# Patient Record
Sex: Female | Born: 1965 | Race: White | Hispanic: No | Marital: Married | Smoking: Never smoker
Health system: Southern US, Community
[De-identification: ages and names within clinical notes are randomized; demographics above are authoritative.]

## PROBLEM LIST (undated history)

## (undated) ENCOUNTER — Emergency Department (HOSPITAL_COMMUNITY): Payer: Self-pay | Source: Home / Self Care

## (undated) DIAGNOSIS — R0600 Dyspnea, unspecified: Secondary | ICD-10-CM

## (undated) DIAGNOSIS — E785 Hyperlipidemia, unspecified: Secondary | ICD-10-CM

## (undated) DIAGNOSIS — I10 Essential (primary) hypertension: Secondary | ICD-10-CM

## (undated) DIAGNOSIS — K219 Gastro-esophageal reflux disease without esophagitis: Secondary | ICD-10-CM

## (undated) HISTORY — DX: Hyperlipidemia, unspecified: E78.5

## (undated) HISTORY — DX: Essential (primary) hypertension: I10

## (undated) HISTORY — PX: BREAST BIOPSY: SHX20

---

## 2011-09-21 ENCOUNTER — Other Ambulatory Visit (HOSPITAL_COMMUNITY): Payer: Self-pay | Admitting: Family Medicine

## 2011-09-21 DIAGNOSIS — Z1231 Encounter for screening mammogram for malignant neoplasm of breast: Secondary | ICD-10-CM

## 2011-09-26 ENCOUNTER — Ambulatory Visit (HOSPITAL_COMMUNITY)
Admission: RE | Admit: 2011-09-26 | Discharge: 2011-09-26 | Disposition: A | Discharge status: Home / Self Care | Payer: Self-pay | Source: Ambulatory Visit | Attending: Family Medicine | Admitting: Family Medicine

## 2011-09-26 DIAGNOSIS — Z1231 Encounter for screening mammogram for malignant neoplasm of breast: Secondary | ICD-10-CM

## 2013-10-28 ENCOUNTER — Other Ambulatory Visit (HOSPITAL_COMMUNITY): Payer: Self-pay | Admitting: Internal Medicine

## 2013-10-28 DIAGNOSIS — Z1231 Encounter for screening mammogram for malignant neoplasm of breast: Secondary | ICD-10-CM

## 2013-11-04 ENCOUNTER — Ambulatory Visit (HOSPITAL_COMMUNITY)
Admission: RE | Admit: 2013-11-04 | Discharge: 2013-11-04 | Disposition: A | Discharge status: Home / Self Care | Payer: Self-pay | Source: Ambulatory Visit | Attending: Internal Medicine | Admitting: Internal Medicine

## 2013-11-04 DIAGNOSIS — Z1231 Encounter for screening mammogram for malignant neoplasm of breast: Secondary | ICD-10-CM

## 2015-09-24 ENCOUNTER — Other Ambulatory Visit (HOSPITAL_COMMUNITY): Payer: Self-pay | Admitting: *Deleted

## 2015-09-24 DIAGNOSIS — Z1231 Encounter for screening mammogram for malignant neoplasm of breast: Secondary | ICD-10-CM

## 2015-10-01 ENCOUNTER — Ambulatory Visit (HOSPITAL_COMMUNITY)
Admission: RE | Admit: 2015-10-01 | Discharge: 2015-10-01 | Disposition: A | Discharge status: Home / Self Care | Payer: Self-pay | Source: Ambulatory Visit | Attending: *Deleted | Admitting: *Deleted

## 2015-10-01 DIAGNOSIS — Z1231 Encounter for screening mammogram for malignant neoplasm of breast: Secondary | ICD-10-CM | POA: Insufficient documentation

## 2015-10-05 ENCOUNTER — Other Ambulatory Visit: Payer: Self-pay | Admitting: *Deleted

## 2015-10-05 DIAGNOSIS — R928 Other abnormal and inconclusive findings on diagnostic imaging of breast: Secondary | ICD-10-CM

## 2015-10-07 ENCOUNTER — Ambulatory Visit (HOSPITAL_COMMUNITY): Payer: Self-pay

## 2015-10-12 ENCOUNTER — Other Ambulatory Visit (HOSPITAL_COMMUNITY): Payer: Self-pay | Admitting: *Deleted

## 2015-10-12 DIAGNOSIS — R928 Other abnormal and inconclusive findings on diagnostic imaging of breast: Secondary | ICD-10-CM

## 2015-10-20 ENCOUNTER — Other Ambulatory Visit: Payer: Self-pay | Admitting: Internal Medicine

## 2015-10-20 ENCOUNTER — Ambulatory Visit (HOSPITAL_COMMUNITY)
Admission: RE | Admit: 2015-10-20 | Discharge: 2015-10-20 | Disposition: A | Discharge status: Home / Self Care | Payer: PRIVATE HEALTH INSURANCE | Source: Ambulatory Visit | Attending: *Deleted | Admitting: *Deleted

## 2015-10-20 ENCOUNTER — Other Ambulatory Visit (HOSPITAL_COMMUNITY): Payer: Self-pay | Admitting: Internal Medicine

## 2015-10-20 ENCOUNTER — Ambulatory Visit (HOSPITAL_COMMUNITY)
Admission: RE | Admit: 2015-10-20 | Discharge: 2015-10-20 | Disposition: A | Discharge status: Home / Self Care | Payer: PRIVATE HEALTH INSURANCE | Source: Ambulatory Visit | Attending: Internal Medicine | Admitting: Internal Medicine

## 2015-10-20 DIAGNOSIS — N6489 Other specified disorders of breast: Secondary | ICD-10-CM

## 2015-10-20 DIAGNOSIS — R928 Other abnormal and inconclusive findings on diagnostic imaging of breast: Secondary | ICD-10-CM | POA: Diagnosis present

## 2015-10-27 ENCOUNTER — Encounter (HOSPITAL_COMMUNITY): Payer: Self-pay

## 2015-10-30 ENCOUNTER — Other Ambulatory Visit: Payer: Self-pay | Admitting: Diagnostic Radiology

## 2015-10-30 ENCOUNTER — Ambulatory Visit
Admission: RE | Admit: 2015-10-30 | Discharge: 2015-10-30 | Disposition: A | Discharge status: Home / Self Care | Payer: No Typology Code available for payment source | Source: Ambulatory Visit | Attending: Internal Medicine | Admitting: Internal Medicine

## 2015-10-30 ENCOUNTER — Other Ambulatory Visit: Payer: Self-pay | Admitting: Internal Medicine

## 2015-10-30 DIAGNOSIS — R928 Other abnormal and inconclusive findings on diagnostic imaging of breast: Secondary | ICD-10-CM

## 2016-03-25 ENCOUNTER — Other Ambulatory Visit: Payer: Self-pay | Admitting: Internal Medicine

## 2016-03-25 DIAGNOSIS — N6489 Other specified disorders of breast: Secondary | ICD-10-CM

## 2016-04-25 ENCOUNTER — Other Ambulatory Visit: Payer: No Typology Code available for payment source

## 2016-10-19 ENCOUNTER — Other Ambulatory Visit: Payer: Self-pay | Admitting: Internal Medicine

## 2016-10-19 DIAGNOSIS — N6489 Other specified disorders of breast: Secondary | ICD-10-CM

## 2016-10-27 ENCOUNTER — Ambulatory Visit
Admission: RE | Admit: 2016-10-27 | Discharge: 2016-10-27 | Disposition: A | Discharge status: Home / Self Care | Payer: BLUE CROSS/BLUE SHIELD | Source: Ambulatory Visit | Attending: Internal Medicine | Admitting: Internal Medicine

## 2016-10-27 DIAGNOSIS — N6489 Other specified disorders of breast: Secondary | ICD-10-CM

## 2018-08-01 ENCOUNTER — Other Ambulatory Visit: Payer: Self-pay | Admitting: Internal Medicine

## 2018-08-01 DIAGNOSIS — Z1231 Encounter for screening mammogram for malignant neoplasm of breast: Secondary | ICD-10-CM

## 2018-08-31 ENCOUNTER — Ambulatory Visit
Admission: RE | Admit: 2018-08-31 | Discharge: 2018-08-31 | Disposition: A | Discharge status: Home / Self Care | Payer: BLUE CROSS/BLUE SHIELD | Source: Ambulatory Visit | Attending: Internal Medicine | Admitting: Internal Medicine

## 2018-08-31 DIAGNOSIS — Z1231 Encounter for screening mammogram for malignant neoplasm of breast: Secondary | ICD-10-CM

## 2019-08-21 ENCOUNTER — Other Ambulatory Visit: Payer: Self-pay | Admitting: *Deleted

## 2019-08-21 DIAGNOSIS — Z20822 Contact with and (suspected) exposure to covid-19: Secondary | ICD-10-CM

## 2019-08-23 LAB — NOVEL CORONAVIRUS, NAA: SARS-CoV-2, NAA: NOT DETECTED

## 2019-10-14 ENCOUNTER — Other Ambulatory Visit: Payer: Self-pay | Admitting: Obstetrics and Gynecology

## 2019-10-14 DIAGNOSIS — Z1231 Encounter for screening mammogram for malignant neoplasm of breast: Secondary | ICD-10-CM

## 2019-10-17 ENCOUNTER — Other Ambulatory Visit: Payer: Self-pay

## 2019-10-17 ENCOUNTER — Ambulatory Visit
Admission: RE | Admit: 2019-10-17 | Discharge: 2019-10-17 | Disposition: A | Discharge status: Home / Self Care | Payer: BLUE CROSS/BLUE SHIELD | Source: Ambulatory Visit | Attending: Obstetrics and Gynecology | Admitting: Obstetrics and Gynecology

## 2019-10-17 DIAGNOSIS — Z1231 Encounter for screening mammogram for malignant neoplasm of breast: Secondary | ICD-10-CM

## 2019-10-18 ENCOUNTER — Other Ambulatory Visit: Payer: Self-pay | Admitting: Obstetrics and Gynecology

## 2019-10-18 DIAGNOSIS — R928 Other abnormal and inconclusive findings on diagnostic imaging of breast: Secondary | ICD-10-CM

## 2019-10-24 ENCOUNTER — Other Ambulatory Visit: Payer: Self-pay | Admitting: Obstetrics and Gynecology

## 2019-10-24 ENCOUNTER — Other Ambulatory Visit: Payer: Self-pay

## 2019-10-24 ENCOUNTER — Ambulatory Visit
Admission: RE | Admit: 2019-10-24 | Discharge: 2019-10-24 | Disposition: A | Discharge status: Home / Self Care | Payer: BC Managed Care – PPO | Source: Ambulatory Visit | Attending: Obstetrics and Gynecology | Admitting: Obstetrics and Gynecology

## 2019-10-24 DIAGNOSIS — R928 Other abnormal and inconclusive findings on diagnostic imaging of breast: Secondary | ICD-10-CM

## 2019-10-24 DIAGNOSIS — N6489 Other specified disorders of breast: Secondary | ICD-10-CM

## 2019-10-29 ENCOUNTER — Other Ambulatory Visit: Payer: Self-pay | Admitting: Obstetrics and Gynecology

## 2019-10-29 ENCOUNTER — Ambulatory Visit
Admission: RE | Admit: 2019-10-29 | Discharge: 2019-10-29 | Disposition: A | Discharge status: Home / Self Care | Payer: BC Managed Care – PPO | Source: Ambulatory Visit | Attending: Obstetrics and Gynecology | Admitting: Obstetrics and Gynecology

## 2019-10-29 ENCOUNTER — Other Ambulatory Visit: Payer: Self-pay

## 2019-10-29 DIAGNOSIS — N6489 Other specified disorders of breast: Secondary | ICD-10-CM

## 2020-04-29 ENCOUNTER — Other Ambulatory Visit: Payer: Self-pay

## 2020-04-29 ENCOUNTER — Other Ambulatory Visit: Payer: Self-pay | Admitting: Obstetrics and Gynecology

## 2020-04-29 ENCOUNTER — Ambulatory Visit
Admission: RE | Admit: 2020-04-29 | Discharge: 2020-04-29 | Disposition: A | Discharge status: Home / Self Care | Payer: BC Managed Care – PPO | Source: Ambulatory Visit | Attending: Obstetrics and Gynecology | Admitting: Obstetrics and Gynecology

## 2020-04-29 DIAGNOSIS — N6489 Other specified disorders of breast: Secondary | ICD-10-CM

## 2020-05-06 ENCOUNTER — Ambulatory Visit
Admission: RE | Admit: 2020-05-06 | Discharge: 2020-05-06 | Disposition: A | Discharge status: Home / Self Care | Payer: 59 | Source: Ambulatory Visit | Attending: Obstetrics and Gynecology | Admitting: Obstetrics and Gynecology

## 2020-05-06 ENCOUNTER — Other Ambulatory Visit: Payer: Self-pay

## 2020-05-06 DIAGNOSIS — N6489 Other specified disorders of breast: Secondary | ICD-10-CM

## 2020-05-29 ENCOUNTER — Other Ambulatory Visit: Payer: Self-pay | Admitting: Surgery

## 2020-05-29 DIAGNOSIS — N6022 Fibroadenosis of left breast: Secondary | ICD-10-CM

## 2020-06-11 ENCOUNTER — Other Ambulatory Visit: Payer: Self-pay | Admitting: Surgery

## 2020-06-11 DIAGNOSIS — N6022 Fibroadenosis of left breast: Secondary | ICD-10-CM

## 2020-07-03 ENCOUNTER — Encounter: Payer: Self-pay | Admitting: Emergency Medicine

## 2020-07-03 ENCOUNTER — Other Ambulatory Visit: Payer: Self-pay

## 2020-07-03 ENCOUNTER — Ambulatory Visit
Admission: EM | Admit: 2020-07-03 | Discharge: 2020-07-03 | Disposition: A | Discharge status: Home / Self Care | Payer: 59 | Attending: Family Medicine | Admitting: Family Medicine

## 2020-07-03 DIAGNOSIS — L245 Irritant contact dermatitis due to other chemical products: Secondary | ICD-10-CM

## 2020-07-03 DIAGNOSIS — N898 Other specified noninflammatory disorders of vagina: Secondary | ICD-10-CM | POA: Diagnosis not present

## 2020-07-03 DIAGNOSIS — R21 Rash and other nonspecific skin eruption: Secondary | ICD-10-CM

## 2020-07-03 MED ORDER — METHYLPREDNISOLONE SODIUM SUCC 125 MG IJ SOLR
125.0000 mg | Freq: Once | INTRAMUSCULAR | Status: AC
  Administered 2020-07-03: 125 mg via INTRAMUSCULAR

## 2020-07-03 MED ORDER — TRIAMCINOLONE ACETONIDE 0.1 % EX CREA: 1.0000 "application " | TOPICAL_CREAM | Freq: Two times a day (BID) | CUTANEOUS | 0 refills | Status: AC

## 2020-07-03 MED ORDER — PREDNISONE 10 MG (21) PO TBPK: ORAL_TABLET | Freq: Every day | ORAL | 0 refills | Status: AC

## 2020-07-03 MED ORDER — FLUCONAZOLE 200 MG PO TABS: 200.0000 mg | ORAL_TABLET | Freq: Once | ORAL | 0 refills | Status: AC

## 2020-07-03 NOTE — Discharge Instructions (Signed)
You have contact dermatitis. This is a rash that appears when your skin is exposed to an irritant.   Eucerin or Aquaphor with triamcinolone cream  Benadryl at night for itching  Use the steroid cream no more than three times per day.  Follow up with your primary care provider, or to see [place] as needed.  Report to the emergency room for shortness of breath, high fever, severe diarrhea, or other concerning symptoms.

## 2020-07-03 NOTE — ED Triage Notes (Signed)
pt states she has a rash all over her body x 4 days states the rash is itchy and burns. Pt is unsure what she has come in contact with. Pt also complains of a yeast infection and would like medication for that.

## 2020-07-06 NOTE — ED Provider Notes (Signed)
[facility]   [id number] 07/03/20 Arrival Time: 1648  CC: RASH  SUBJECTIVE:  Samantha Carr is a 54 y.o. female who presents with a skin complaint that began 4 days ago. Reports that she has been swimming and that she used an anti-algae chemical in her pool. Reports that the is the same brand that she usually uses. She reports that she usually waits longer to get in the pool afterward than she did with last use. Denies medications change or starting a new medication recently.  Reports rash as red, raised and itchy. Has used hydrocortisone cream without relief. There are no aggravating or alleviating factors.   Denies fever, chills, nausea, vomiting, erythema, swelling, discharge, oral lesions, SOB, chest pain, abdominal pain, changes in bowel or bladder function.    Also reports vaginal discharge and itching. Reports hx yeast infections. Requesting diflucan prescription. Denies concern for STDs.  ROS: As per HPI.  All other pertinent ROS negative.     History reviewed. No pertinent past medical history. Past Surgical History:  Procedure Laterality Date  . BREAST BIOPSY Left    No Known Allergies No current facility-administered medications on file prior to encounter.   No current outpatient medications on file prior to encounter.   Social History   Socioeconomic History  . Marital status: Married    Spouse name: Not on file  . Number of children: Not on file  . Years of education: Not on file  . Highest education level: Not on file  Occupational History  . Not on file  Tobacco Use  . Smoking status: Never Smoker  . Smokeless tobacco: Never Used  Substance and Sexual Activity  . Alcohol use: Not on file  . Drug use: Not on file  . Sexual activity: Not on file  Other Topics Concern  . Not on file  Social History Narrative  . Not on file   Social Determinants of Health   Financial Resource Strain:   . Difficulty of Paying Living Expenses: Not on file    Food Insecurity:   . Worried About Programme researcher, broadcasting/film/video in the Last Year: Not on file  . Ran Out of Food in the Last Year: Not on file  Transportation Needs:   . Lack of Transportation (Medical): Not on file  . Lack of Transportation (Non-Medical): Not on file  Physical Activity:   . Days of Exercise per Week: Not on file  . Minutes of Exercise per Session: Not on file  Stress:   . Feeling of Stress : Not on file  Social Connections:   . Frequency of Communication with Friends and Family: Not on file  . Frequency of Social Gatherings with Friends and Family: Not on file  . Attends Religious Services: Not on file  . Active Member of Clubs or Organizations: Not on file  . Attends Banker Meetings: Not on file  . Marital Status: Not on file  Intimate Partner Violence:   . Fear of Current or Ex-Partner: Not on file  . Emotionally Abused: Not on file  . Physically Abused: Not on file  . Sexually Abused: Not on file   Family History  Problem Relation Age of Onset  . Breast cancer Maternal Aunt     OBJECTIVE: Vitals:   07/03/20 1807  BP: (!) 151/92  Pulse: 88  Resp: 17  Temp: 98.8 F (37.1 C)  TempSrc: Oral  SpO2: 97%    General appearance: alert; no distress Head: NCAT Lungs:  clear to auscultation bilaterally Heart: regular rate and rhythm.  Radial pulse 2+ bilaterally Extremities: no edema Skin: warm and dry; papular urticaria, generalized to trunk, shoulders, arms Psychological: alert and cooperative; normal mood and affect  ASSESSMENT & PLAN:  1. Irritant contact dermatitis due to other chemical products   2. Rash and nonspecific skin eruption   3. Vaginal itching   4. Vaginal discharge     Meds ordered this encounter  Medications  . fluconazole (DIFLUCAN) 200 MG tablet    Sig: Take 1 tablet (200 mg total) by mouth once for 1 dose.    Dispense:  2 tablet    Refill:  0    Order Specific Question:   Supervising Provider    Answer:   Merrilee Jansky [id number]  . triamcinolone cream (KENALOG) 0.1 %    Sig: Apply 1 application topically 2 (two) times daily.    Dispense:  30 g    Refill:  0    Order Specific Question:   Supervising Provider    Answer:   Merrilee Jansky [id number]  . methylPREDNISolone sodium succinate (SOLU-MEDROL) 125 mg/2 mL injection 125 mg  . predniSONE (STERAPRED UNI-PAK 21 TAB) 10 MG (21) TBPK tablet    Sig: Take by mouth daily for 6 days. Take 6 tablets on day 1, 5 tablets on day 2, 4 tablets on day 3, 3 tablets on day 4, 2 tablets on day 5, 1 tablet on day 6    Dispense:  21 tablet    Refill:  0    Order Specific Question:   Supervising Provider    Answer:   Merrilee Jansky [[id number]]    Solumedrol 125mg  in office today Prescribed triamcinolone cream Prescribed prednisone taper Prescribed fluconazole Highly suspect irritant dermatitis from pool chemical Take as prescribed and to completion Avoid hot showers/ baths Moisturize skin daily  Follow up with PCP if symptoms persists Return or go to the ER if you have any new or worsening symptoms such as fever, chills, nausea, vomiting, redness, swelling, discharge, if symptoms do not improve with medications  Reviewed expectations re: course of current medical issues. Questions answered. Outlined signs and symptoms indicating need for more acute intervention. Patient verbalized understanding. After Visit Summary given.   , NP 07/06/20 1045

## 2020-07-08 ENCOUNTER — Encounter: Payer: Self-pay | Admitting: *Deleted

## 2020-07-08 ENCOUNTER — Other Ambulatory Visit: Payer: 59

## 2020-07-09 ENCOUNTER — Ambulatory Visit: Payer: 59 | Admitting: Cardiology

## 2020-07-15 ENCOUNTER — Other Ambulatory Visit: Payer: Self-pay

## 2020-07-15 ENCOUNTER — Other Ambulatory Visit (HOSPITAL_COMMUNITY)
Admission: RE | Admit: 2020-07-15 | Discharge: 2020-07-15 | Disposition: A | Discharge status: Home / Self Care | Payer: 59 | Source: Ambulatory Visit | Attending: Internal Medicine | Admitting: Internal Medicine

## 2020-07-15 DIAGNOSIS — Z01812 Encounter for preprocedural laboratory examination: Secondary | ICD-10-CM | POA: Diagnosis present

## 2020-07-15 DIAGNOSIS — Z20822 Contact with and (suspected) exposure to covid-19: Secondary | ICD-10-CM | POA: Insufficient documentation

## 2020-07-15 LAB — SARS CORONAVIRUS 2 (TAT 6-24 HRS): SARS Coronavirus 2: NEGATIVE

## 2020-07-16 ENCOUNTER — Other Ambulatory Visit (HOSPITAL_COMMUNITY): Payer: Self-pay | Admitting: Internal Medicine

## 2020-07-16 DIAGNOSIS — R0600 Dyspnea, unspecified: Secondary | ICD-10-CM

## 2020-07-16 DIAGNOSIS — R0609 Other forms of dyspnea: Secondary | ICD-10-CM

## 2020-07-17 ENCOUNTER — Other Ambulatory Visit: Payer: Self-pay | Admitting: *Deleted

## 2020-07-17 ENCOUNTER — Ambulatory Visit (HOSPITAL_COMMUNITY)
Admission: RE | Admit: 2020-07-17 | Discharge: 2020-07-17 | Disposition: A | Discharge status: Home / Self Care | Payer: 59 | Source: Ambulatory Visit | Attending: Internal Medicine | Admitting: Internal Medicine

## 2020-07-17 ENCOUNTER — Other Ambulatory Visit (HOSPITAL_COMMUNITY): Payer: Self-pay | Admitting: Internal Medicine

## 2020-07-17 ENCOUNTER — Other Ambulatory Visit: Payer: Self-pay

## 2020-07-17 ENCOUNTER — Ambulatory Visit (HOSPITAL_BASED_OUTPATIENT_CLINIC_OR_DEPARTMENT_OTHER)
Admission: RE | Admit: 2020-07-17 | Discharge: 2020-07-17 | Disposition: A | Discharge status: Home / Self Care | Payer: 59 | Source: Ambulatory Visit | Attending: Internal Medicine | Admitting: Internal Medicine

## 2020-07-17 DIAGNOSIS — R0600 Dyspnea, unspecified: Secondary | ICD-10-CM

## 2020-07-17 DIAGNOSIS — Z01818 Encounter for other preprocedural examination: Secondary | ICD-10-CM

## 2020-07-17 DIAGNOSIS — R0602 Shortness of breath: Secondary | ICD-10-CM

## 2020-07-17 DIAGNOSIS — R0609 Other forms of dyspnea: Secondary | ICD-10-CM

## 2020-07-17 LAB — EXERCISE TOLERANCE TEST
Estimated workload: 7 METS
Exercise duration (min): 4 min
Exercise duration (sec): 3 s
MPHR: 168 {beats}/min
Peak HR: 153 {beats}/min
Percent HR: 91 %
RPE: 16
Rest HR: 81 {beats}/min

## 2020-07-17 LAB — ECHOCARDIOGRAM COMPLETE
AR max vel: 2.34 cm2
AV Area VTI: 2.13 cm2
AV Area mean vel: 2.18 cm2
AV Mean grad: 4.1 mmHg
AV Peak grad: 7.6 mmHg
Ao pk vel: 1.38 m/s
Area-P 1/2: 4.15 cm2
S' Lateral: 1.86 cm

## 2020-07-17 NOTE — Progress Notes (Signed)
*  PRELIMINARY RESULTS* Echocardiogram 2D Echocardiogram has been performed.  Samantha Carr 07/17/2020, 12:24 PM

## 2020-07-17 NOTE — Progress Notes (Signed)
Faxed order placed in chart

## 2020-07-18 ENCOUNTER — Other Ambulatory Visit (HOSPITAL_COMMUNITY): Payer: 59

## 2020-07-21 ENCOUNTER — Telehealth: Payer: Self-pay | Admitting: Cardiology

## 2020-07-21 NOTE — Telephone Encounter (Signed)
Fax [phone]    Provider office called and they want interpretation faxed to them from the [facility]

## 2020-07-21 NOTE — Telephone Encounter (Signed)
Faxes sent to [facility] and received by [place].

## 2020-08-15 ENCOUNTER — Other Ambulatory Visit (HOSPITAL_COMMUNITY): Payer: 59

## 2020-08-18 ENCOUNTER — Encounter (HOSPITAL_BASED_OUTPATIENT_CLINIC_OR_DEPARTMENT_OTHER): Payer: Self-pay | Admitting: Surgery

## 2020-08-20 ENCOUNTER — Encounter (HOSPITAL_BASED_OUTPATIENT_CLINIC_OR_DEPARTMENT_OTHER): Payer: Self-pay | Admitting: Surgery

## 2020-08-20 ENCOUNTER — Other Ambulatory Visit: Payer: Self-pay

## 2020-08-22 ENCOUNTER — Inpatient Hospital Stay (HOSPITAL_COMMUNITY): Admission: RE | Admit: 2020-08-22 | Payer: 59 | Source: Ambulatory Visit

## 2020-08-24 ENCOUNTER — Other Ambulatory Visit (HOSPITAL_COMMUNITY)
Admission: RE | Admit: 2020-08-24 | Discharge: 2020-08-24 | Disposition: A | Discharge status: Home / Self Care | Payer: 59 | Source: Ambulatory Visit | Attending: Surgery | Admitting: Surgery

## 2020-08-24 DIAGNOSIS — Z20822 Contact with and (suspected) exposure to covid-19: Secondary | ICD-10-CM | POA: Insufficient documentation

## 2020-08-24 DIAGNOSIS — Z01812 Encounter for preprocedural laboratory examination: Secondary | ICD-10-CM | POA: Insufficient documentation

## 2020-08-24 LAB — SARS CORONAVIRUS 2 (TAT 6-24 HRS): SARS Coronavirus 2: NEGATIVE

## 2020-08-25 ENCOUNTER — Ambulatory Visit
Admission: RE | Admit: 2020-08-25 | Discharge: 2020-08-25 | Disposition: A | Discharge status: Home / Self Care | Payer: 59 | Source: Ambulatory Visit | Attending: Surgery | Admitting: Surgery

## 2020-08-25 ENCOUNTER — Other Ambulatory Visit: Payer: Self-pay

## 2020-08-25 DIAGNOSIS — N6022 Fibroadenosis of left breast: Secondary | ICD-10-CM

## 2020-08-25 MED ORDER — ENSURE PRE-SURGERY PO LIQD: 296.0000 mL | Freq: Once | ORAL | Status: DC

## 2020-08-25 NOTE — H&P (Signed)
   Samantha Carr  Location: [facility] Patient #: [id number] DOB: [birth year] Married / Language: English / Race: White Female   History of Present Illness  The patient is a 54 year old female who presents with a complaint of Breast problems.  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 She has developed another suspicious area in the left breast. It was followed initially and was going to be biopsied in December but could not be localized on ultrasound. Then again persistent follow-up films in June of this year. She underwent a another mammogram  showing a 8 mm lesion at the 3 o'clock position of the left breast. This was finally biopsied showing a complex sclerosing lesion with ductal hyperplasia. She has a family history of a maternal aunt who developed breast cancer in her 82s and passed away from breast cancer. She is otherwise healthy without complaints. She denies nipple discharge.   Allergies (Tanisha A. Manson Passey, RMA; 05/29/2020 9:21 AM) No Known Drug Allergies  [05/29/2020]: Allergies Reconciled   Medication History (Tanisha A. Manson Passey, RMA; 05/29/2020 9:21 AM) Pravastatin Sodium (20MG  Tablet, Oral) Active. Losartan Potassium (100MG  Tablet, Oral) Active. Vitamin D (50 MCG(2000 UT) Tablet, Oral) Active. Medications Reconciled  Vitals   Weight: 211.4 lb Height: 64in Body Surface Area: 2 m Body Mass Index: 36.29 kg/m  Temp.: 98.4F  Pulse: 103 (Regular)  BP: 132/86(Sitting, Left Arm, Standard)     Physical Exam  The physical exam findings are as follows: Note: She appears well and examination  There are no palpable breast masses and no axillary adenopathy. 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 She has a complex sclerosing lesion on the left breast. I discussed the diagnosis with the patient and her husband. I gave them a copy of the pathology results. Given the abnormality on mammogram, the pathologic findings, and her family history of breast cancer, a radioactive seed left breast lumpectomy is recommended. I discussed the procedure with them in detail. I discussed the risk of the procedure which includes but is not limited to bleeding, infection, injury to surrounding structures, the need for further procedures if malignancy is found, cardiopulmonary issues, postoperative recovery, etc. They understand and wish to proceed with surgery.

## 2020-08-25 NOTE — Progress Notes (Signed)

## 2020-08-26 ENCOUNTER — Ambulatory Visit (HOSPITAL_BASED_OUTPATIENT_CLINIC_OR_DEPARTMENT_OTHER): Payer: 59 | Admitting: Certified Registered"

## 2020-08-26 ENCOUNTER — Other Ambulatory Visit: Payer: Self-pay

## 2020-08-26 ENCOUNTER — Encounter (HOSPITAL_BASED_OUTPATIENT_CLINIC_OR_DEPARTMENT_OTHER): Payer: Self-pay | Admitting: Surgery

## 2020-08-26 ENCOUNTER — Ambulatory Visit (HOSPITAL_COMMUNITY)
Admission: RE | Admit: 2020-08-26 | Discharge: 2020-08-26 | Disposition: A | Discharge status: Home / Self Care | Payer: 59 | Attending: Surgery | Admitting: Surgery

## 2020-08-26 ENCOUNTER — Ambulatory Visit
Admission: RE | Admit: 2020-08-26 | Discharge: 2020-08-26 | Disposition: A | Discharge status: Home / Self Care | Payer: 59 | Source: Ambulatory Visit | Attending: Surgery | Admitting: Surgery

## 2020-08-26 ENCOUNTER — Encounter (HOSPITAL_BASED_OUTPATIENT_CLINIC_OR_DEPARTMENT_OTHER)
Admission: RE | Disposition: A | Discharge status: Home / Self Care | Payer: Self-pay | Source: Home / Self Care | Attending: Surgery

## 2020-08-26 DIAGNOSIS — Z803 Family history of malignant neoplasm of breast: Secondary | ICD-10-CM | POA: Diagnosis not present

## 2020-08-26 DIAGNOSIS — N6092 Unspecified benign mammary dysplasia of left breast: Secondary | ICD-10-CM | POA: Insufficient documentation

## 2020-08-26 DIAGNOSIS — N63 Unspecified lump in unspecified breast: Secondary | ICD-10-CM | POA: Diagnosis present

## 2020-08-26 DIAGNOSIS — K219 Gastro-esophageal reflux disease without esophagitis: Secondary | ICD-10-CM | POA: Diagnosis not present

## 2020-08-26 DIAGNOSIS — I10 Essential (primary) hypertension: Secondary | ICD-10-CM | POA: Insufficient documentation

## 2020-08-26 DIAGNOSIS — N6022 Fibroadenosis of left breast: Secondary | ICD-10-CM | POA: Diagnosis not present

## 2020-08-26 DIAGNOSIS — Z79899 Other long term (current) drug therapy: Secondary | ICD-10-CM | POA: Insufficient documentation

## 2020-08-26 HISTORY — PX: BREAST LUMPECTOMY WITH RADIOACTIVE SEED LOCALIZATION: SHX6424

## 2020-08-26 HISTORY — DX: Gastro-esophageal reflux disease without esophagitis: K21.9

## 2020-08-26 HISTORY — DX: Dyspnea, unspecified: R06.00

## 2020-08-26 SURGERY — BREAST LUMPECTOMY WITH RADIOACTIVE SEED LOCALIZATION
Anesthesia: General | Site: Breast | Laterality: Left

## 2020-08-26 MED ORDER — ACETAMINOPHEN 500 MG PO TABS
1000.0000 mg | ORAL_TABLET | ORAL | Status: AC
  Administered 2020-08-26: 1000 mg via ORAL

## 2020-08-26 MED ORDER — CHLORHEXIDINE GLUCONATE CLOTH 2 % EX PADS: 6.0000 | MEDICATED_PAD | Freq: Once | CUTANEOUS | Status: DC

## 2020-08-26 MED ORDER — HYDROMORPHONE HCL 1 MG/ML IJ SOLN
INTRAMUSCULAR | Status: AC
  Filled 2020-08-26: qty 0.5

## 2020-08-26 MED ORDER — BUPIVACAINE HCL (PF) 0.25 % IJ SOLN
INTRAMUSCULAR | Status: AC
  Filled 2020-08-26: qty 30

## 2020-08-26 MED ORDER — TRAMADOL HCL 50 MG PO TABS: 50.0000 mg | ORAL_TABLET | Freq: Four times a day (QID) | ORAL | 0 refills | Status: AC | PRN

## 2020-08-26 MED ORDER — CEFAZOLIN SODIUM-DEXTROSE 2-4 GM/100ML-% IV SOLN
INTRAVENOUS | Status: AC
  Filled 2020-08-26: qty 100

## 2020-08-26 MED ORDER — FENTANYL CITRATE (PF) 100 MCG/2ML IJ SOLN
INTRAMUSCULAR | Status: AC
  Filled 2020-08-26: qty 2

## 2020-08-26 MED ORDER — MIDAZOLAM HCL 5 MG/5ML IJ SOLN
INTRAMUSCULAR | Status: DC | PRN
  Administered 2020-08-26: 2 mg via INTRAVENOUS

## 2020-08-26 MED ORDER — PROPOFOL 500 MG/50ML IV EMUL
INTRAVENOUS | Status: DC | PRN
  Administered 2020-08-26: 25 ug/kg/min via INTRAVENOUS

## 2020-08-26 MED ORDER — AMISULPRIDE (ANTIEMETIC) 5 MG/2ML IV SOLN
5.0000 mg | Freq: Once | INTRAVENOUS | Status: AC
  Administered 2020-08-26: 5 mg via INTRAVENOUS

## 2020-08-26 MED ORDER — CEFAZOLIN SODIUM-DEXTROSE 2-4 GM/100ML-% IV SOLN: 2.0000 g | INTRAVENOUS | Status: DC

## 2020-08-26 MED ORDER — MIDAZOLAM HCL 2 MG/2ML IJ SOLN
INTRAMUSCULAR | Status: AC
  Filled 2020-08-26: qty 2

## 2020-08-26 MED ORDER — BUPIVACAINE HCL (PF) 0.25 % IJ SOLN
INTRAMUSCULAR | Status: DC | PRN
  Administered 2020-08-26: 15 mL

## 2020-08-26 MED ORDER — FENTANYL CITRATE (PF) 100 MCG/2ML IJ SOLN
INTRAMUSCULAR | Status: DC | PRN
  Administered 2020-08-26: 100 ug via INTRAVENOUS

## 2020-08-26 MED ORDER — CELECOXIB 200 MG PO CAPS
400.0000 mg | ORAL_CAPSULE | ORAL | Status: AC
  Administered 2020-08-26: 400 mg via ORAL

## 2020-08-26 MED ORDER — HYDROMORPHONE HCL 1 MG/ML IJ SOLN
0.2500 mg | INTRAMUSCULAR | Status: DC | PRN
  Administered 2020-08-26 (×2): 0.25 mg via INTRAVENOUS

## 2020-08-26 MED ORDER — ONDANSETRON HCL 4 MG/2ML IJ SOLN
INTRAMUSCULAR | Status: DC | PRN
  Administered 2020-08-26: 4 mg via INTRAVENOUS

## 2020-08-26 MED ORDER — CELECOXIB 200 MG PO CAPS
ORAL_CAPSULE | ORAL | Status: AC
  Filled 2020-08-26: qty 2

## 2020-08-26 MED ORDER — LIDOCAINE HCL (CARDIAC) PF 100 MG/5ML IV SOSY
PREFILLED_SYRINGE | INTRAVENOUS | Status: DC | PRN
  Administered 2020-08-26: 60 mg via INTRAVENOUS

## 2020-08-26 MED ORDER — GABAPENTIN 300 MG PO CAPS
300.0000 mg | ORAL_CAPSULE | ORAL | Status: AC
  Administered 2020-08-26: 300 mg via ORAL

## 2020-08-26 MED ORDER — AMISULPRIDE (ANTIEMETIC) 5 MG/2ML IV SOLN
INTRAVENOUS | Status: AC
  Filled 2020-08-26: qty 2

## 2020-08-26 MED ORDER — ACETAMINOPHEN 500 MG PO TABS
ORAL_TABLET | ORAL | Status: AC
  Filled 2020-08-26: qty 2

## 2020-08-26 MED ORDER — DEXAMETHASONE SODIUM PHOSPHATE 4 MG/ML IJ SOLN
INTRAMUSCULAR | Status: DC | PRN
  Administered 2020-08-26: 4 mg via INTRAVENOUS

## 2020-08-26 MED ORDER — LACTATED RINGERS IV SOLN: INTRAVENOUS | Status: DC

## 2020-08-26 MED ORDER — GABAPENTIN 300 MG PO CAPS
ORAL_CAPSULE | ORAL | Status: AC
  Filled 2020-08-26: qty 1

## 2020-08-26 MED ORDER — PROPOFOL 10 MG/ML IV BOLUS
INTRAVENOUS | Status: DC | PRN
  Administered 2020-08-26: 150 mg via INTRAVENOUS

## 2020-08-26 SURGICAL SUPPLY — 45 items
APPLIER CLIP 9.375 MED OPEN (MISCELLANEOUS)
BINDER BREAST 3XL (GAUZE/BANDAGES/DRESSINGS) IMPLANT
BINDER BREAST LRG (GAUZE/BANDAGES/DRESSINGS) IMPLANT
BINDER BREAST MEDIUM (GAUZE/BANDAGES/DRESSINGS) IMPLANT
BINDER BREAST XLRG (GAUZE/BANDAGES/DRESSINGS) ×2 IMPLANT
BINDER BREAST XXLRG (GAUZE/BANDAGES/DRESSINGS) IMPLANT
BLADE SURG 15 STRL LF DISP TIS (BLADE) ×1 IMPLANT
BLADE SURG 15 STRL SS (BLADE) ×1
CANISTER SUC SOCK COL 7IN (MISCELLANEOUS) IMPLANT
CANISTER SUCT 1200ML W/VALVE (MISCELLANEOUS) IMPLANT
CHLORAPREP W/TINT 26 (MISCELLANEOUS) ×2 IMPLANT
CLIP APPLIE 9.375 MED OPEN (MISCELLANEOUS) IMPLANT
COVER BACK TABLE 60X90IN (DRAPES) ×2 IMPLANT
COVER MAYO STAND STRL (DRAPES) ×2 IMPLANT
COVER PROBE W GEL 5X96 (DRAPES) ×2 IMPLANT
COVER WAND RF STERILE (DRAPES) IMPLANT
DECANTER SPIKE VIAL GLASS SM (MISCELLANEOUS) IMPLANT
DERMABOND ADVANCED (GAUZE/BANDAGES/DRESSINGS) ×1
DERMABOND ADVANCED .7 DNX12 (GAUZE/BANDAGES/DRESSINGS) ×1 IMPLANT
DRAPE LAPAROSCOPIC ABDOMINAL (DRAPES) ×2 IMPLANT
DRAPE UTILITY XL STRL (DRAPES) ×2 IMPLANT
ELECT REM PT RETURN 9FT ADLT (ELECTROSURGICAL) ×2
ELECTRODE REM PT RTRN 9FT ADLT (ELECTROSURGICAL) ×1 IMPLANT
GAUZE SPONGE 4X4 12PLY STRL LF (GAUZE/BANDAGES/DRESSINGS) IMPLANT
GLOVE SURG SIGNA 7.5 PF LTX (GLOVE) ×2 IMPLANT
GOWN STRL REUS W/ TWL LRG LVL3 (GOWN DISPOSABLE) ×1 IMPLANT
GOWN STRL REUS W/ TWL XL LVL3 (GOWN DISPOSABLE) ×1 IMPLANT
GOWN STRL REUS W/TWL LRG LVL3 (GOWN DISPOSABLE) ×1
GOWN STRL REUS W/TWL XL LVL3 (GOWN DISPOSABLE) ×1
KIT MARKER MARGIN INK (KITS) ×2 IMPLANT
NEEDLE HYPO 25X1 1.5 SAFETY (NEEDLE) ×2 IMPLANT
NS IRRIG 1000ML POUR BTL (IV SOLUTION) IMPLANT
PACK BASIN DAY SURGERY FS (CUSTOM PROCEDURE TRAY) ×2 IMPLANT
PENCIL SMOKE EVACUATOR (MISCELLANEOUS) ×2 IMPLANT
SLEEVE SCD COMPRESS KNEE MED (MISCELLANEOUS) ×2 IMPLANT
SPONGE LAP 4X18 RFD (DISPOSABLE) ×2 IMPLANT
SUT MNCRL AB 4-0 PS2 18 (SUTURE) ×2 IMPLANT
SUT SILK 2 0 SH (SUTURE) IMPLANT
SUT VIC AB 3-0 SH 27 (SUTURE) ×1
SUT VIC AB 3-0 SH 27X BRD (SUTURE) ×1 IMPLANT
SYR CONTROL 10ML LL (SYRINGE) ×2 IMPLANT
TOWEL GREEN STERILE FF (TOWEL DISPOSABLE) ×2 IMPLANT
TRAY FAXITRON CT DISP (TRAY / TRAY PROCEDURE) ×2 IMPLANT
TUBE CONNECTING 20X1/4 (TUBING) IMPLANT
YANKAUER SUCT BULB TIP NO VENT (SUCTIONS) IMPLANT

## 2020-08-26 NOTE — Op Note (Addendum)
LEFT BREAST LUMPECTOMY WITH RADIOACTIVE SEED LOCALIZATION  Procedure Note  Samantha Carr 08/26/2020   Pre-op Diagnosis: LEFT BREAST COMPLEX SCLEROSING LESION     Post-op Diagnosis: same  Procedure(s): LEFT BREAST LUMPECTOMY WITH RADIOACTIVE SEED LOCALIZATION  Surgeon(s): Abigail Miyamoto, MD  Anesthesia: General  Staff:  Circulator: Raliegh Scarlet, RN Scrub Person: Barrington Ellison, RN Circulator Assistant: Patrice Paradise, RN  Estimated Blood Loss: Minimal               Specimens: sent to path  Indications: This is a 54 year old female was found to have an abnormality in the left breast on screening mammography.  She underwent a biopsy of this showing a complex sclerosing lesion.  The decision was made to proceed with a radioactive seed guided left breast lumpectomy  Procedure: The patient was brought to the operating room and identifies correct patient.  She is placed upon the operating table general anesthesia was induced.  Her left breast was prepped and draped in usual sterile fashion.  I located the seed at the lateral deep breast at approximately 4 o'clock position with the neoprobe.  I anesthetized the edge of the areola with Marcaine and then made a circumareolar incision with a scalpel.  I then dissected laterally and deep with the electrocautery and the aid of the neoprobe.  I then was able to dissect into the deep breast tissue and perform a lumpectomy removing the radioactive seed and area of suspicion with the aid of the neoprobe and the cautery.  Once the specimen was removed I marked all margins with pain.  An x-ray on the specimen confirmed the radioactive seed and previous tissue marker were in place.  The specimen was sent to pathology for evaluation.  I achieved hemostasis with cautery.  I then closed subcutaneous tissue with interrupted 3-0 Vicryl sutures and closed skin with a running 4-0 Monocryl.  Dermabond was then applied.  The patient was then  placed in a breast binder.  She tolerated the procedure well.  All the counts were correct at the end of the procedure.  She was then extubated in the operating room and taken in a stable condition to the recovery room.          Abigail Miyamoto   Date: 08/26/2020  Time: 8:56 AM

## 2020-08-26 NOTE — Transfer of Care (Signed)
Immediate Anesthesia Transfer of Care Note  Patient: Samantha Carr  Procedure(s) Performed: LEFT BREAST LUMPECTOMY WITH RADIOACTIVE SEED LOCALIZATION (Left Breast)  Patient Location: PACU  Anesthesia Type:General  Level of Consciousness: sedated and patient cooperative  Airway & Oxygen Therapy: Patient Spontanous Breathing and Patient connected to face mask oxygen  Post-op Assessment: Report given to RN and Post -op Vital signs reviewed and stable  Post vital signs: Reviewed and stable  Last Vitals:  Vitals Value Taken Time  BP    Temp    Pulse 65 08/26/20 0902  Resp 7 08/26/20 0902  SpO2 100 % 08/26/20 0902  Vitals shown include unvalidated device data.  Last Pain:  Vitals:   08/26/20 0708  TempSrc: Oral  PainSc: 0-No pain         Complications: No complications documented.

## 2020-08-26 NOTE — Discharge Instructions (Signed)
[place] Office Phone Number [phone]  BREAST BIOPSY/ PARTIAL MASTECTOMY: POST OP INSTRUCTIONS  Always review your discharge instruction sheet given to you by the facility where your surgery was performed.  IF YOU HAVE DISABILITY OR FAMILY LEAVE FORMS, YOU MUST BRING THEM TO THE OFFICE FOR PROCESSING.  DO NOT GIVE THEM TO YOUR DOCTOR.  1. A prescription for pain medication may be given to you upon discharge.  Take your pain medication as prescribed, if needed.  If narcotic pain medicine is not needed, then you may take acetaminophen (Tylenol) or ibuprofen (Advil) as needed. 2. Take your usually prescribed medications unless otherwise directed 3. If you need a refill on your pain medication, please contact your pharmacy.  They will contact our office to request authorization.  Prescriptions will not be filled after 5pm or on week-ends. 4. You should eat very light the first 24 hours after surgery, such as soup, crackers, pudding, etc.  Resume your normal diet the day after surgery. 5. Most patients will experience some swelling and bruising in the breast.  Ice packs and a good support bra will help.  Swelling and bruising can take several days to resolve.  6. It is common to experience some constipation if taking pain medication after surgery.  Increasing fluid intake and taking a stool softener will usually help or prevent this problem from occurring.  A mild laxative (Milk of Magnesia or Miralax) should be taken according to package directions if there are no bowel movements after 48 hours. 7. Unless discharge instructions indicate otherwise, you may remove your bandages 24-48 hours after surgery, and you may shower at that time.  You may have steri-strips (small skin tapes) in place directly over the incision.  These strips should be left on the skin for 7-10 days.  If your surgeon used skin glue on the incision, you may shower in 24 hours.  The glue will flake off over the  next 2-3 weeks.  Any sutures or staples will be removed at the office during your follow-up visit. 8. ACTIVITIES:  You may resume regular daily activities (gradually increasing) beginning the next day.  Wearing a good support bra or sports bra minimizes pain and swelling.  You may have sexual intercourse when it is comfortable. a. You may drive when you no longer are taking prescription pain medication, you can comfortably wear a seatbelt, and you can safely maneuver your car and apply brakes. b. RETURN TO WORK:  ______________________________________________________________________________________ 9. You should see your doctor in the office for a follow-up appointment approximately two weeks after your surgery.  Your doctor's nurse will typically make your follow-up appointment when she calls you with your pathology report.  Expect your pathology report 2-3 business days after your surgery.  You may call to check if you do not hear from [place] after three days. 10. OTHER INSTRUCTIONS:YOU MAY REMOVE THE BINDER AND SHOWER STARTING TOMORROW 11. ICE PACK, TYLENOL, AND IBUPROFEN ALSO FOR PAIN 12. NO VIGOROUS ACTIVITY FOR ONE WEEK _______________________________________________________________________________________________ _____________________________________________________________________________________________________________________________________ _____________________________________________________________________________________________________________________________________ _____________________________________________________________________________________________________________________________________  WHEN TO CALL YOUR DOCTOR: 1. Fever over 101.0 2. Nausea and/or vomiting. 3. Extreme swelling or bruising. 4. Continued bleeding from incision. 5. Increased pain, redness, or drainage from the incision.  The clinic staff is available to answer your questions during regular business hours.   Please don't hesitate to call and ask to speak to one of the nurses for clinical concerns.  If you have a medical emergency, go to the nearest emergency room or  call 911.  A surgeon from [facility] is always on call at the hospital.  For further questions, please visit [email or web address]     Post Anesthesia Home Care Instructions  Activity: Get plenty of rest for the remainder of the day. A responsible individual must stay with you for 24 hours following the procedure.  For the next 24 hours, DO NOT: -Drive a car -Advertising copywriter -Drink alcoholic beverages -Take any medication unless instructed by your physician -Make any legal decisions or sign important papers.  Meals: Start with liquid foods such as gelatin or soup. Progress to regular foods as tolerated. Avoid greasy, spicy, heavy foods. If nausea and/or vomiting occur, drink only clear liquids until the nausea and/or vomiting subsides. Call your physician if vomiting continues.  Special Instructions/Symptoms: Your throat may feel dry or sore from the anesthesia or the breathing tube placed in your throat during surgery. If this causes discomfort, gargle with warm salt water. The discomfort should disappear within 24 hours.  Next dose of Tylenol can be given at 1:15pm if needed. Next dose of NSAIDS (Ibuprofen/Motrin/Aleve) can be given at 3:15 if needed.

## 2020-08-26 NOTE — Anesthesia Procedure Notes (Signed)
Procedure Name: LMA Insertion Date/Time: 08/26/2020 8:16 AM Performed by: Sheryn Bison, CRNA Pre-anesthesia Checklist: Patient identified, Emergency Drugs available, Suction available and Patient being monitored Patient Re-evaluated:Patient Re-evaluated prior to induction Oxygen Delivery Method: Circle System Utilized Preoxygenation: Pre-oxygenation with 100% oxygen Induction Type: IV induction Ventilation: Mask ventilation without difficulty LMA: LMA inserted LMA Size: 4.0 Number of attempts: 1 Airway Equipment and Method: bite block Placement Confirmation: positive ETCO2 Tube secured with: Tape Dental Injury: Teeth and Oropharynx as per pre-operative assessment

## 2020-08-26 NOTE — Anesthesia Preprocedure Evaluation (Addendum)
Anesthesia Evaluation  Patient identified by MRN, date of birth, ID band Patient awake    Reviewed: Allergy & Precautions, H&P , NPO status , Patient's Chart, lab work & pertinent test results  Airway Mallampati: II  TM Distance: >3 FB Neck ROM: Full    Dental no notable dental hx. (+) Teeth Intact, Dental Advisory Given   Pulmonary neg pulmonary ROS,    Pulmonary exam normal breath sounds clear to auscultation       Cardiovascular hypertension, Pt. on medications  Rhythm:Regular Rate:Normal     Neuro/Psych negative neurological ROS  negative psych ROS   GI/Hepatic Neg liver ROS, GERD  Medicated,  Endo/Other  negative endocrine ROS  Renal/GU negative Renal ROS  negative genitourinary   Musculoskeletal   Abdominal   Peds  Hematology negative hematology ROS (+)   Anesthesia Other Findings   Reproductive/Obstetrics negative OB ROS                            Anesthesia Physical Anesthesia Plan  ASA: II  Anesthesia Plan: General   Post-op Pain Management:    Induction: Intravenous  PONV Risk Score and Plan: 4 or greater and Ondansetron, Dexamethasone and Midazolam  Airway Management Planned: LMA  Additional Equipment:   Intra-op Plan:   Post-operative Plan: Extubation in OR  Informed Consent: I have reviewed the patients History and Physical, chart, labs and discussed the procedure including the risks, benefits and alternatives for the proposed anesthesia with the patient or authorized representative who has indicated his/her understanding and acceptance.     Dental advisory given  Plan Discussed with: CRNA  Anesthesia Plan Comments:         Anesthesia Quick Evaluation

## 2020-08-26 NOTE — Interval H&P Note (Signed)
History and Physical Interval Note: no change in H and P  08/26/2020 8:05 AM  Samantha Carr  has presented today for surgery, with the diagnosis of LEFT BREAST COMPLEX SCLEROSING LESION.  The various methods of treatment have been discussed with the patient and family. After consideration of risks, benefits and other options for treatment, the patient has consented to  Procedure(s) with comments: LEFT BREAST LUMPECTOMY WITH RADIOACTIVE SEED LOCALIZATION (Left) - LMA as a surgical intervention.  The patient's history has been reviewed, patient examined, no change in status, stable for surgery.  I have reviewed the patient's chart and labs.  Questions were answered to the patient's satisfaction.     Abigail Miyamoto

## 2020-08-26 NOTE — Anesthesia Postprocedure Evaluation (Signed)
Anesthesia Post Note  Patient: Samantha Carr  Procedure(s) Performed: LEFT BREAST LUMPECTOMY WITH RADIOACTIVE SEED LOCALIZATION (Left Breast)     Patient location during evaluation: PACU Anesthesia Type: General Level of consciousness: awake and alert Pain management: pain level controlled Vital Signs Assessment: post-procedure vital signs reviewed and stable Respiratory status: spontaneous breathing, nonlabored ventilation and respiratory function stable Cardiovascular status: blood pressure returned to baseline and stable Postop Assessment: no apparent nausea or vomiting Anesthetic complications: no   No complications documented.  Last Vitals:  Vitals:   08/26/20 0939 08/26/20 1033  BP: 120/89 109/75  Pulse: 62 61  Resp: 12 20  Temp:  36.5 C  SpO2: 95% 97%    Last Pain:  Vitals:   08/26/20 1033  TempSrc: Oral  PainSc: 3                  Brystol Wasilewski,W. EDMOND

## 2020-08-27 ENCOUNTER — Encounter (HOSPITAL_BASED_OUTPATIENT_CLINIC_OR_DEPARTMENT_OTHER): Payer: Self-pay | Admitting: Surgery

## 2020-08-29 LAB — SURGICAL PATHOLOGY

## 2020-10-01 ENCOUNTER — Telehealth: Payer: Self-pay | Admitting: *Deleted

## 2020-10-01 NOTE — Telephone Encounter (Signed)
Pt was told via phone from [facility] cardiology that both echo and stress test she had done at [facility] were "fine"  and scheduled her a f/u this month however pt cannot afford to keep f/u appt with [facility] cardiology as not covered by her insurance and wanted to know if provider that read her test results at [facility] could verify that they were normal - pt made aware that would need to get [facility] cardiology or pcp to refer her to [place] and we would be happy to schedule her for f/u (pt says that we would accept her insurance)

## 2020-10-01 NOTE — Telephone Encounter (Signed)
Im confused as tests were ordered by [facility] cardiology and would be up to them to interpret the results for her, from a note they have in the computer they had contacted her with the results. Even if she does not follow with them in the future they would still be able to interpret the results for her and let her know if anyhting else may need to be done. If pcp feels she needs a regular cardiolgist can refer to use we would be happy to see, but the clinical loop would be between her pcp and [facility] cardiology unless she is referred to [place] for an evaluation   Dominga Ferry MD

## 2020-10-01 NOTE — Telephone Encounter (Signed)
Pt had stress test and echo done and requesting results, says her insurance would not pay for her to go back to cardiology at [facility] and if she needs cardiology f/u in the future she would like to be scheduled w our group

## 2020-12-29 ENCOUNTER — Other Ambulatory Visit: Payer: Self-pay | Admitting: Family Medicine

## 2020-12-29 DIAGNOSIS — Z853 Personal history of malignant neoplasm of breast: Secondary | ICD-10-CM

## 2021-01-14 ENCOUNTER — Encounter: Payer: Self-pay | Admitting: Emergency Medicine

## 2021-01-14 ENCOUNTER — Ambulatory Visit
Admission: EM | Admit: 2021-01-14 | Discharge: 2021-01-14 | Disposition: A | Discharge status: Home / Self Care | Payer: 59 | Attending: Emergency Medicine | Admitting: Emergency Medicine

## 2021-01-14 ENCOUNTER — Other Ambulatory Visit: Payer: Self-pay

## 2021-01-14 DIAGNOSIS — H8309 Labyrinthitis, unspecified ear: Secondary | ICD-10-CM

## 2021-01-14 DIAGNOSIS — R42 Dizziness and giddiness: Secondary | ICD-10-CM

## 2021-01-14 MED ORDER — PREDNISONE 10 MG PO TABS: 20.0000 mg | ORAL_TABLET | Freq: Every day | ORAL | 0 refills | Status: AC

## 2021-01-14 MED ORDER — MECLIZINE HCL 25 MG PO TABS: 25.0000 mg | ORAL_TABLET | Freq: Three times a day (TID) | ORAL | 0 refills | Status: AC | PRN

## 2021-01-14 NOTE — ED Triage Notes (Signed)
Vertigo that started after having covid in January of this year.  Pt states it used to be worse in the morning but now she is having it throughout the day. Would like ears checked, thinks her hearing has decreased.

## 2021-01-14 NOTE — ED Provider Notes (Signed)
[facility]   [id number] 01/14/21 Arrival Time: 1426  PJ:[id number]  SUBJECTIVE:  Samantha Carr is a 55 y.o. female who presented to the urgent care with a complaint of dizziness in January 2022 when she was diagnosed with Covid.  Denies a precipitating event, trauma, or recent URI within the past month.  Describes the dizziness as "the room spinning".  States that it is intermittent with episodes lasting few seconds to few minutes.  Has not tried any OTC medication.  Report dizziness with movement.  Denies  previous symptoms  in the past.  Denies fever, chills, nausea, vomiting, hearing changes, tinnitus, ear pain, chest pain, syncope, SOB, weakness, slurred speech, memory or emotional changes, facial drooping/ asymmetry, incoordination, numbness or tingling, abdominal pain, changes in bowel or bladder habits.     ROS: As per HPI.  All other pertinent ROS negative.      Past Medical History:  Diagnosis Date  . Dyslipidemia   . Dyspnea   . GERD (gastroesophageal reflux disease)   . Hypertension    Past Surgical History:  Procedure Laterality Date  . BREAST BIOPSY Left   . BREAST LUMPECTOMY WITH RADIOACTIVE SEED LOCALIZATION Left 08/26/2020   Procedure: LEFT BREAST LUMPECTOMY WITH RADIOACTIVE SEED LOCALIZATION;  Surgeon: Abigail Miyamoto, MD;  Location: [facility];  Service: General;  Laterality: Left;  LMA  . CESAREAN SECTION     No Known Allergies No current facility-administered medications on file prior to encounter.   Current Outpatient Medications on File Prior to Encounter  Medication Sig Dispense Refill  . amLODipine (NORVASC) 5 MG tablet Take 5 mg by mouth daily.    Marland Kitchen atorvastatin (LIPITOR) 40 MG tablet Take 40 mg by mouth daily.    . Cholecalciferol (VITAMIN D-3) 125 MCG (5000 UT) TABS Take by mouth.    . famotidine (PEPCID) 20 MG tablet Take 20 mg by mouth 2 (two) times daily.    Marland Kitchen losartan (COZAAR) 100 MG tablet Take 100 mg by mouth  daily.    . traMADol (ULTRAM) 50 MG tablet Take 1 tablet (50 mg total) by mouth every 6 (six) hours as needed for moderate pain or severe pain. 20 tablet 0  . triamcinolone cream (KENALOG) 0.1 % Apply 1 application topically 2 (two) times daily. 30 g 0   Social History   Socioeconomic History  . Marital status: Married    Spouse name: Not on file  . Number of children: Not on file  . Years of education: Not on file  . Highest education level: Not on file  Occupational History  . Not on file  Tobacco Use  . Smoking status: Never Smoker  . Smokeless tobacco: Never Used  Vaping Use  . Vaping Use: Never used  Substance and Sexual Activity  . Alcohol use: Never  . Drug use: Never  . Sexual activity: Not on file  Other Topics Concern  . Not on file  Social History Narrative  . Not on file   Social Determinants of Health   Financial Resource Strain: Not on file  Food Insecurity: Not on file  Transportation Needs: Not on file  Physical Activity: Not on file  Stress: Not on file  Social Connections: Not on file  Intimate Partner Violence: Not on file   Family History  Problem Relation Age of Onset  . Breast cancer Maternal Aunt     OBJECTIVE:  Vitals:   01/14/21 1450  BP: 117/66  Pulse: 89  Resp: 18  Temp: 98.6 F (37 C)  TempSrc: Oral  SpO2: 96%    General appearance: alert; no distress Eyes: PERRLA; EOMI; conjunctiva normal HENT: normocephalic; atraumatic; TMs normal; nasal mucosa normal; oral mucosa normal Neck: supple with FROM Lungs: clear to auscultation bilaterally Heart: regular rate and rhythm Abdomen: soft, non-tender; bowel sounds normal Extremities: no cyanosis or edema; symmetrical with no gross deformities Skin: warm and dry Neurologic: normal gait; normal symmetric reflexes; CN 2-12 grossly intact Psychological: alert and cooperative; normal mood and affect  Labs:  No results found for this or any previous visit (from the past 24 hour(s)). No  orders found for this or any previous visit.  No results found.  ASSESSMENT & PLAN:  1. Dizziness   2. Labyrinthitis, unspecified laterality     Meds ordered this encounter  Medications  . meclizine (ANTIVERT) 25 MG tablet    Sig: Take 1 tablet (25 mg total) by mouth 3 (three) times daily as needed for dizziness.    Dispense:  30 tablet    Refill:  0  . predniSONE (DELTASONE) 10 MG tablet    Sig: Take 2 tablets (20 mg total) by mouth daily for 5 days.    Dispense:  10 tablet    Refill:  0   Patient is stable at discharge.  Dizziness was not related to orthostatic blood pressure as BP were almost identical in different position.  Symptom could be likely from labyrinthitis.  Will prescribe meclizine and prednisone.  Discharge instructions  Meclizine was prescribed/take as directed Prednisone was prescribed Follow-up with PCP Return or go to ED if you develop any new or worsening of your symptoms  Reviewed expectations re: course of current medical issues. Questions answered. Outlined signs and symptoms indicating need for more acute intervention. Patient verbalized understanding. After Visit Summary given.    Durward Parcel, FNP 01/14/21 1525

## 2021-01-14 NOTE — Discharge Instructions (Addendum)
Meclizine was prescribed/take as directed Prednisone was prescribed Follow-up with PCP Return or go to ED if you develop any new or worsening of your symptoms

## 2021-02-17 ENCOUNTER — Other Ambulatory Visit: Payer: Self-pay | Admitting: Family Medicine

## 2021-02-17 DIAGNOSIS — N6099 Unspecified benign mammary dysplasia of unspecified breast: Secondary | ICD-10-CM

## 2021-02-18 ENCOUNTER — Ambulatory Visit
Admission: RE | Admit: 2021-02-18 | Discharge: 2021-02-18 | Disposition: A | Discharge status: Home / Self Care | Payer: 59 | Source: Ambulatory Visit | Attending: Family Medicine | Admitting: Family Medicine

## 2021-02-18 ENCOUNTER — Other Ambulatory Visit: Payer: Self-pay

## 2021-02-18 DIAGNOSIS — N6099 Unspecified benign mammary dysplasia of unspecified breast: Secondary | ICD-10-CM

## 2021-12-23 ENCOUNTER — Encounter (HOSPITAL_COMMUNITY): Payer: Self-pay

## 2022-03-24 ENCOUNTER — Other Ambulatory Visit: Payer: Self-pay | Admitting: Family Medicine

## 2022-03-24 DIAGNOSIS — R102 Pelvic and perineal pain: Secondary | ICD-10-CM

## 2022-03-28 ENCOUNTER — Other Ambulatory Visit (HOSPITAL_BASED_OUTPATIENT_CLINIC_OR_DEPARTMENT_OTHER): Payer: Self-pay | Admitting: Family Medicine

## 2022-03-28 DIAGNOSIS — R102 Pelvic and perineal pain: Secondary | ICD-10-CM

## 2022-03-31 ENCOUNTER — Other Ambulatory Visit: Payer: Self-pay | Admitting: Family Medicine

## 2022-03-31 DIAGNOSIS — Z1231 Encounter for screening mammogram for malignant neoplasm of breast: Secondary | ICD-10-CM

## 2022-05-05 ENCOUNTER — Ambulatory Visit: Payer: Self-pay

## 2022-05-26 ENCOUNTER — Ambulatory Visit: Payer: Self-pay

## 2022-07-07 ENCOUNTER — Ambulatory Visit: Payer: Self-pay

## 2022-07-20 DIAGNOSIS — M25561 Pain in right knee: Secondary | ICD-10-CM | POA: Diagnosis not present

## 2022-07-20 DIAGNOSIS — M25562 Pain in left knee: Secondary | ICD-10-CM | POA: Diagnosis not present

## 2022-09-15 ENCOUNTER — Other Ambulatory Visit: Payer: Self-pay | Admitting: Family Medicine

## 2022-09-15 DIAGNOSIS — Z1231 Encounter for screening mammogram for malignant neoplasm of breast: Secondary | ICD-10-CM

## 2022-09-16 ENCOUNTER — Ambulatory Visit
Admission: RE | Admit: 2022-09-16 | Discharge: 2022-09-16 | Disposition: A | Discharge status: Home / Self Care | Payer: 59 | Source: Ambulatory Visit | Attending: Family Medicine | Admitting: Family Medicine

## 2022-09-16 ENCOUNTER — Other Ambulatory Visit: Payer: Self-pay | Admitting: Family Medicine

## 2022-09-16 DIAGNOSIS — N644 Mastodynia: Secondary | ICD-10-CM

## 2022-09-16 DIAGNOSIS — Z1231 Encounter for screening mammogram for malignant neoplasm of breast: Secondary | ICD-10-CM

## 2022-09-29 ENCOUNTER — Other Ambulatory Visit: Payer: 59

## 2022-10-20 ENCOUNTER — Ambulatory Visit: Admission: RE | Admit: 2022-10-20 | Payer: 59 | Source: Ambulatory Visit

## 2022-10-20 ENCOUNTER — Ambulatory Visit
Admission: RE | Admit: 2022-10-20 | Discharge: 2022-10-20 | Disposition: A | Discharge status: Home / Self Care | Payer: 59 | Source: Ambulatory Visit | Attending: Family Medicine | Admitting: Family Medicine

## 2022-10-20 DIAGNOSIS — N644 Mastodynia: Secondary | ICD-10-CM

## 2022-11-16 ENCOUNTER — Telehealth: Payer: Self-pay | Admitting: *Deleted

## 2022-11-16 NOTE — Telephone Encounter (Signed)
1st attempt to reach pt regarding surgical clearance and the need for a in-office appointment.  Voicemail box full and couldn't leave a message.

## 2022-11-16 NOTE — Telephone Encounter (Signed)
   Name: Samantha Carr  DOB: May 23, 1966  MRN: [id number]  Primary Cardiologist: Rozann Lesches, MD  Chart reviewed as part of pre-operative protocol coverage. Because of Samantha Carr past medical history and time since last visit, she will require a follow-up in-office visit in order to better assess preoperative cardiovascular risk.  Pre-op covering staff: - Please schedule appointment and call patient to inform them. If patient already had an upcoming appointment within acceptable timeframe, please add "pre-op clearance" to the appointment notes so provider is aware. - Please contact requesting surgeon's office via preferred method (i.e, phone, fax) to inform them of need for appointment prior to surgery.  There were no medications indicated for holding.    Mable Fill, Marissa Nestle, NP  11/16/2022, 9:38 AM

## 2022-11-16 NOTE — Telephone Encounter (Signed)
   Pre-operative Risk Assessment    Patient Name: Samantha Carr  DOB: [birth year] MRN: [id number]      Request for Surgical Clearance    Procedure:   RIGHT TOTAL KNEE ARTHROPLASTY  Date of Surgery:  Clearance TBD                                 Surgeon:  DR. Esmond Plants Surgeon's Group or Practice Name:  [facility] Phone number:  [phone]  [place] Fax number:  [phone]   Type of Clearance Requested:   - Medical    Type of Anesthesia:  General AND SPINAL ARE BOTH MARKED   Additional requests/questions:    Astrid Divine   11/16/2022, 9:06 AM

## 2022-11-17 NOTE — Telephone Encounter (Signed)
I s/w the pt and she tells me that she does not see our practice and never has. Pt tells me that she is adopted and she knew her biological mother had some heart problems and wanted to make sure she did not have the same issues heart wise. She had seen Dr. Cathie Hoops [facility] 07/02/20. Dr. Candis Musa ordered some testing but her insurance would not pay where Dr. Candis Musa had ordered the test to be done. However, they did cover if she had her test done at [facility], which she did. Our cardiologist Dr. Carlyle Dolly read the test as Dr. Candis Musa was not able to read the test. We have never seen the pt in our office and Dr. Harl Bowie only read a test the pt had done per the pt. Pt feels that she does not need a cardiologist and that back 2021 was just wanting to make sure her heart was good. Pt said she s/w Vernie Ammons at surgeon office and explained this to her. She said if Dr. Veverly Fells really feels that she needs to be seen by a cardiologist she will do what she needs to do. I assured the pt that if that turns out to be the case, that we are more than happy to see her as a NEW PT. I assured the pt that I will fax these notes to Dr. Veverly Fells. Pt thanked me for the call and the help.

## 2022-11-23 ENCOUNTER — Other Ambulatory Visit: Payer: 59

## 2022-12-08 DIAGNOSIS — M1711 Unilateral primary osteoarthritis, right knee: Secondary | ICD-10-CM | POA: Diagnosis not present

## 2022-12-08 DIAGNOSIS — Z0189 Encounter for other specified special examinations: Secondary | ICD-10-CM | POA: Diagnosis not present

## 2022-12-14 DIAGNOSIS — Z1211 Encounter for screening for malignant neoplasm of colon: Secondary | ICD-10-CM | POA: Diagnosis not present

## 2022-12-14 DIAGNOSIS — Z1212 Encounter for screening for malignant neoplasm of rectum: Secondary | ICD-10-CM | POA: Diagnosis not present

## 2022-12-16 NOTE — Telephone Encounter (Signed)
Requesting office faxed over another clearance request, see previous notes. Only received a clearance request again today, not a referral. Surgeon office should reach out to Dr. Jodelle Gross Assar as that is who she was seeing.

## 2022-12-26 DIAGNOSIS — G8918 Other acute postprocedural pain: Secondary | ICD-10-CM | POA: Diagnosis not present

## 2022-12-26 DIAGNOSIS — M1711 Unilateral primary osteoarthritis, right knee: Secondary | ICD-10-CM | POA: Diagnosis not present

## 2022-12-30 DIAGNOSIS — M25561 Pain in right knee: Secondary | ICD-10-CM | POA: Diagnosis not present

## 2023-01-04 DIAGNOSIS — M25561 Pain in right knee: Secondary | ICD-10-CM | POA: Diagnosis not present

## 2023-01-05 DIAGNOSIS — Z4789 Encounter for other orthopedic aftercare: Secondary | ICD-10-CM | POA: Diagnosis not present

## 2023-01-06 DIAGNOSIS — M25561 Pain in right knee: Secondary | ICD-10-CM | POA: Diagnosis not present

## 2023-01-10 DIAGNOSIS — M25561 Pain in right knee: Secondary | ICD-10-CM | POA: Diagnosis not present

## 2023-01-17 DIAGNOSIS — M25561 Pain in right knee: Secondary | ICD-10-CM | POA: Diagnosis not present

## 2023-01-20 DIAGNOSIS — M25561 Pain in right knee: Secondary | ICD-10-CM | POA: Diagnosis not present

## 2023-01-24 DIAGNOSIS — M25561 Pain in right knee: Secondary | ICD-10-CM | POA: Diagnosis not present

## 2023-01-27 DIAGNOSIS — M25561 Pain in right knee: Secondary | ICD-10-CM | POA: Diagnosis not present

## 2023-01-30 DIAGNOSIS — M25561 Pain in right knee: Secondary | ICD-10-CM | POA: Diagnosis not present

## 2023-02-08 DIAGNOSIS — M25561 Pain in right knee: Secondary | ICD-10-CM | POA: Diagnosis not present

## 2023-02-10 DIAGNOSIS — M25561 Pain in right knee: Secondary | ICD-10-CM | POA: Diagnosis not present

## 2023-02-13 DIAGNOSIS — M25561 Pain in right knee: Secondary | ICD-10-CM | POA: Diagnosis not present

## 2023-02-15 DIAGNOSIS — M25561 Pain in right knee: Secondary | ICD-10-CM | POA: Diagnosis not present

## 2023-02-23 DIAGNOSIS — M25561 Pain in right knee: Secondary | ICD-10-CM | POA: Diagnosis not present

## 2023-02-27 DIAGNOSIS — M25561 Pain in right knee: Secondary | ICD-10-CM | POA: Diagnosis not present

## 2023-02-28 DIAGNOSIS — M25561 Pain in right knee: Secondary | ICD-10-CM | POA: Diagnosis not present

## 2023-03-01 DIAGNOSIS — M1712 Unilateral primary osteoarthritis, left knee: Secondary | ICD-10-CM | POA: Diagnosis not present

## 2023-03-18 IMAGING — MG DIGITAL DIAGNOSTIC BILAT W/ TOMO W/ CAD
8 series · 8 of 24 positions shown · non-contrast
Comparison: Previous exam(s).

CLINICAL DATA: 55-year-old female with history of LEFT breast DUSHAWNE
and lobular neoplasia excised on 08/26/2020.For annual bilateral
mammogram.

EXAM:
DIGITAL DIAGNOSTIC BILATERAL MAMMOGRAM WITH CAD AND TOMO

[R CC synth-2D]
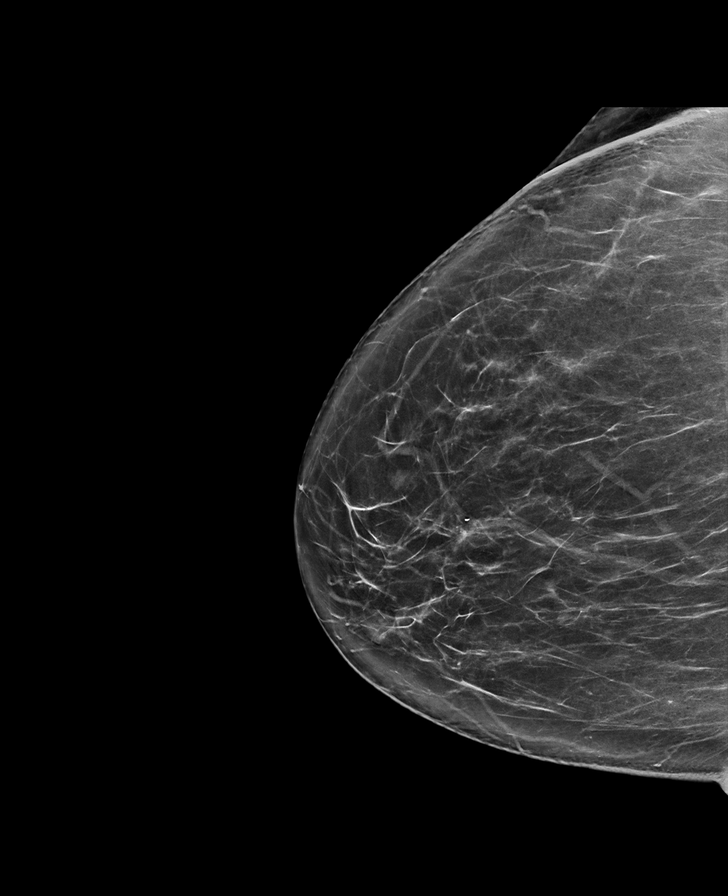

[L CC synth-2D]
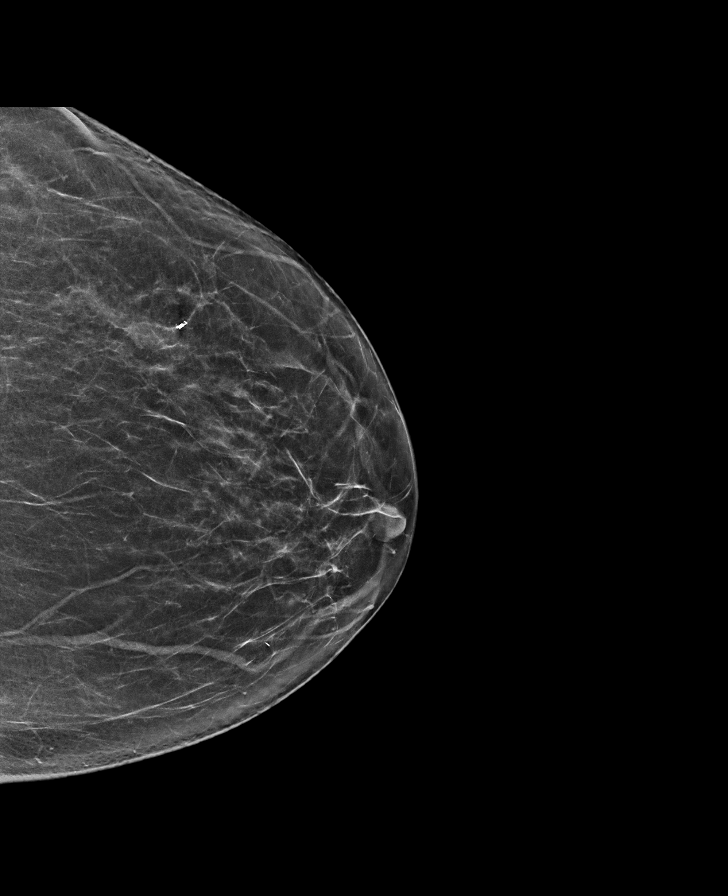

[L MLO synth-2D]
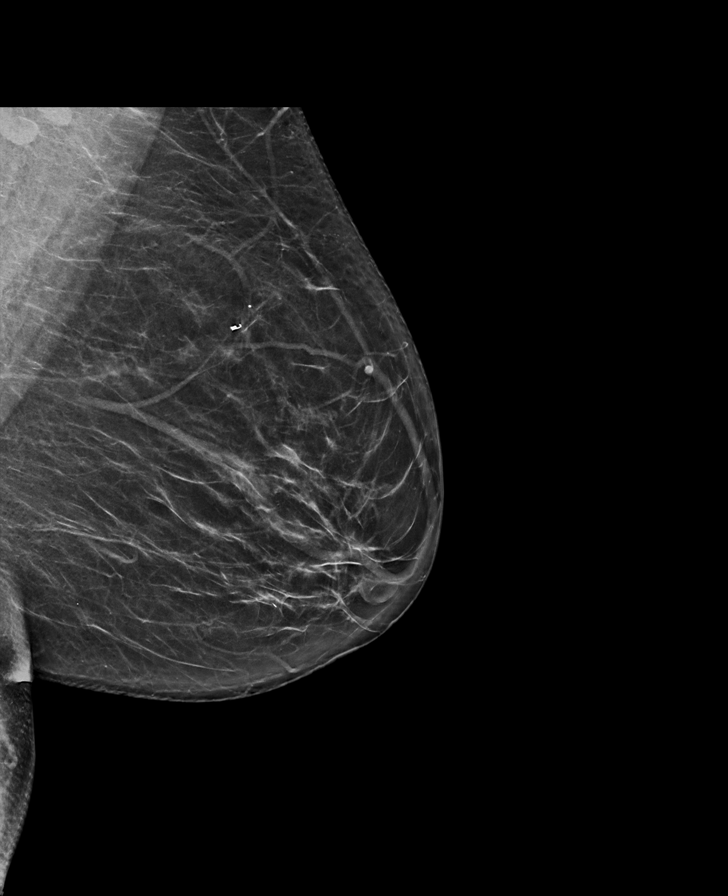

[R MLO synth-2D]
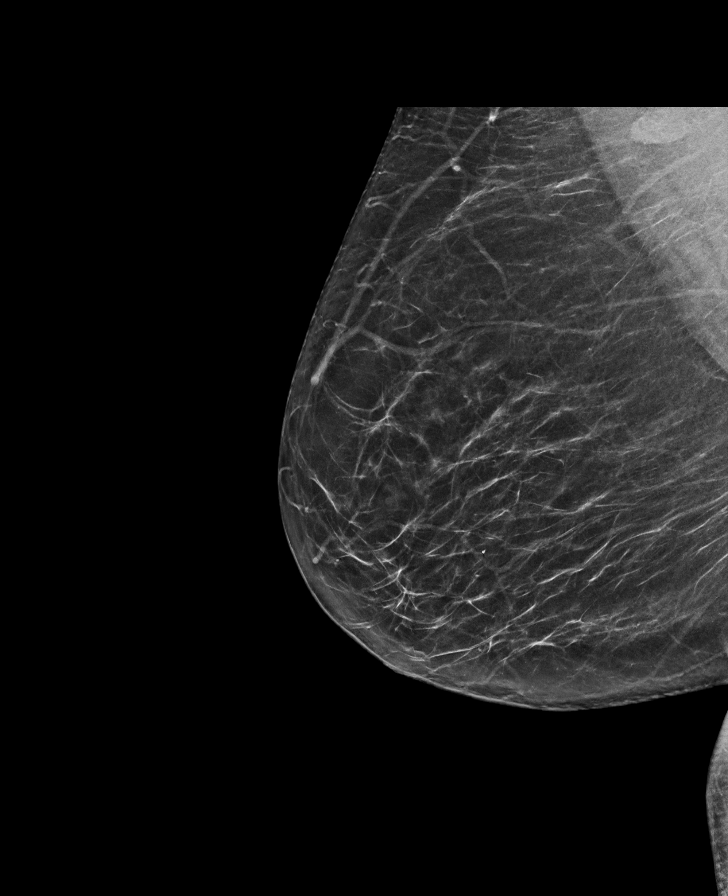

[R CC tomo · tomo slice 37/74.0]
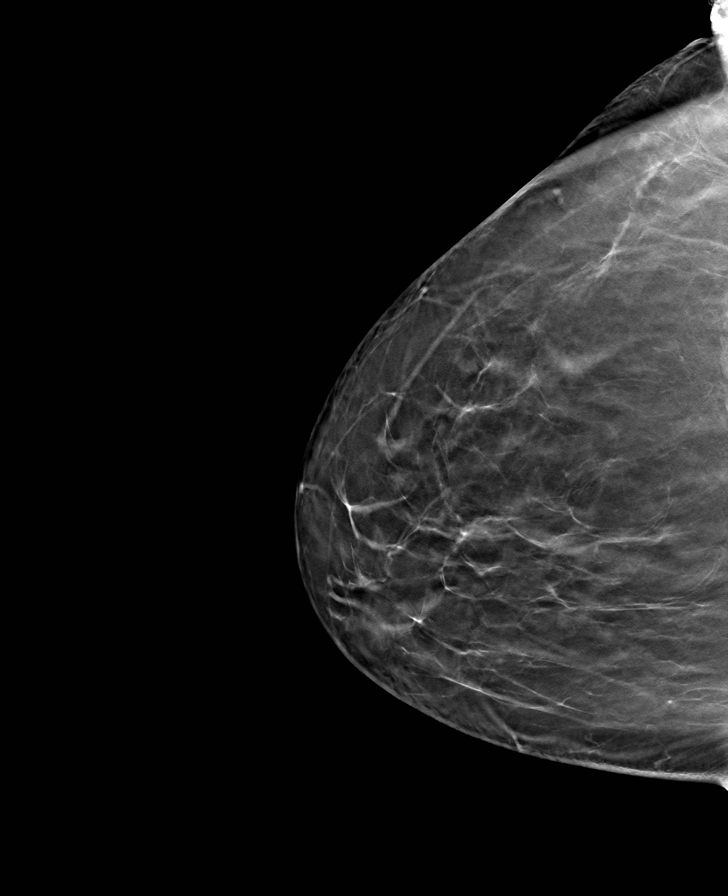

[L MLO tomo · tomo slice 36/71.0]
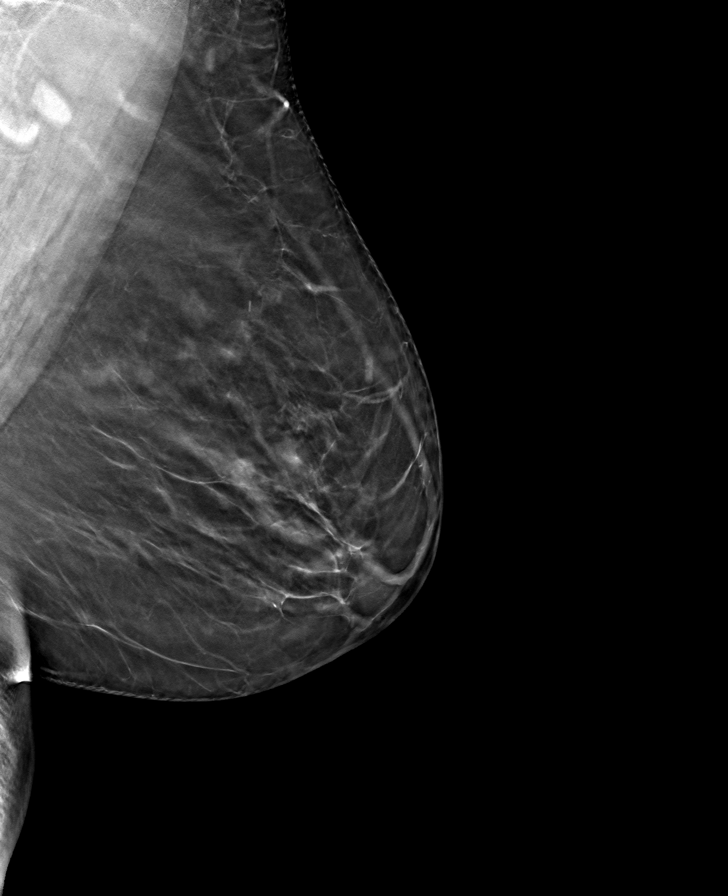

[R MLO tomo · tomo slice 36/71.0]
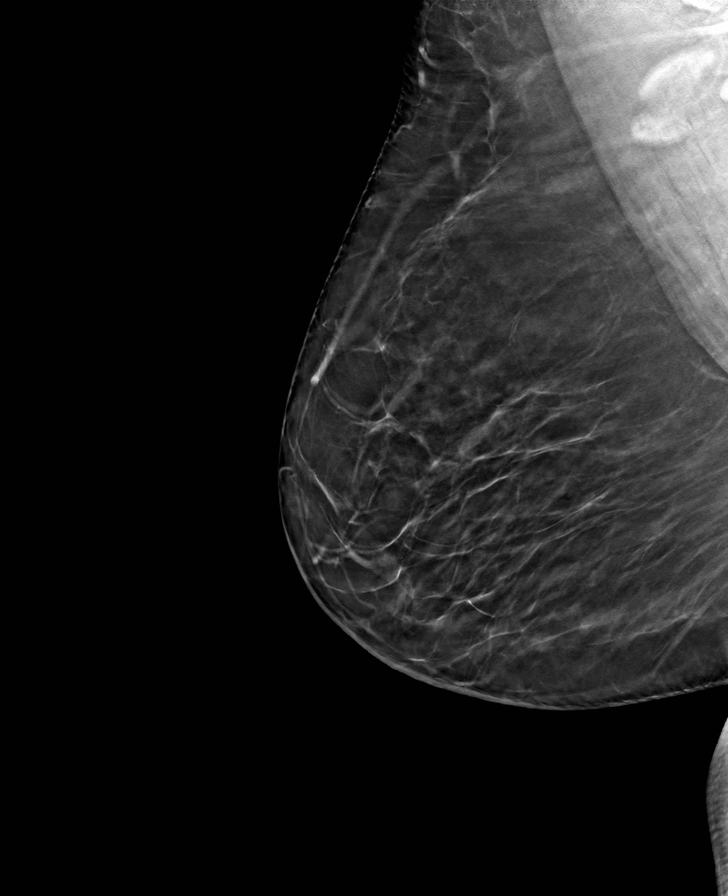

[L CC tomo · tomo slice 33/66.0]
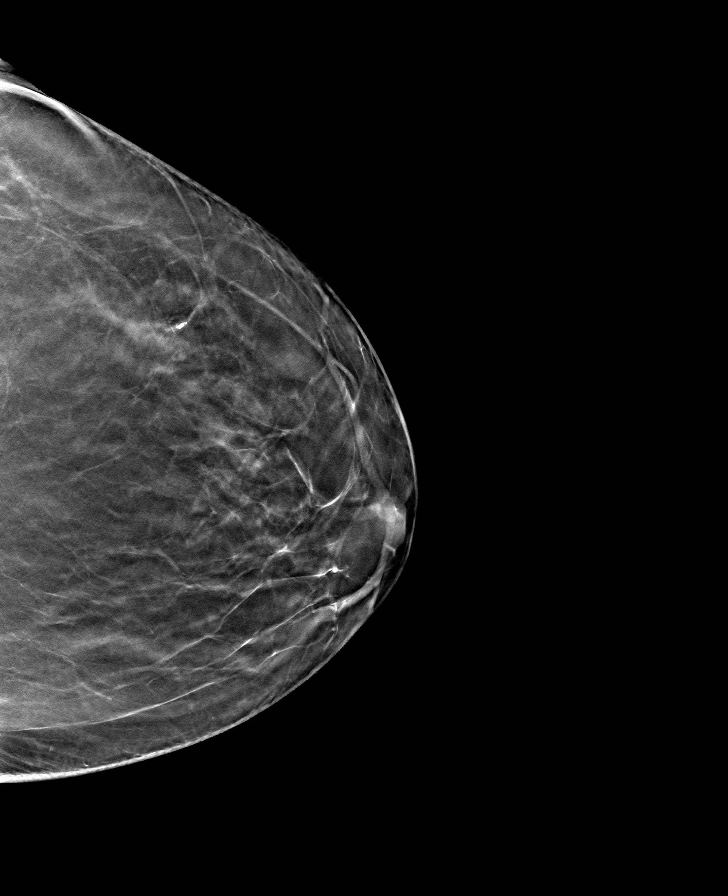

[8 of 24 positions shown; findings below may reference images not displayed]

ACR Breast Density Category b: There are scattered areas of
fibroglandular density.
FINDINGS: 2D/3D full field views of both breasts demonstrate no suspicious
mass, distortion or worrisome calcifications.

A COIL biopsy clip within the UPPER-OUTER LEFT breast is noted in
area of remote benign biopsy.

Mammographic images were processed with CAD.
IMPRESSION: No mammographic evidence of breast malignancy.

RECOMMENDATION:
Bilateral screening mammogram in 1 year.

I have discussed the findings and recommendations with the patient.
If applicable, a reminder letter will be sent to the patient
regarding the next appointment.

BI-RADS CATEGORY  2: Benign.

## 2023-04-12 DIAGNOSIS — Z4789 Encounter for other orthopedic aftercare: Secondary | ICD-10-CM | POA: Diagnosis not present

## 2023-05-04 ENCOUNTER — Telehealth: Payer: Self-pay | Admitting: *Deleted

## 2023-05-04 NOTE — Telephone Encounter (Signed)
   Pre-operative Risk Assessment    Patient Name: Samantha Carr  DOB: 23-Sep-1966 MRN: [id number]      Request for Surgical Clearance    Procedure:   LEFT TOTAL KNEE ARTHROPLASTY  Date of Surgery:  Clearance TBD                                 Surgeon:  DR. Malon Kindle Surgeon's Group or Practice Name:  [facility] Phone number:  [phone] ATTN: MEGAN DAVIS Fax number:  [phone]   Type of Clearance Requested:   - Medical ; NO MEDICATIONS LISTED AS NEEDING TO BE HELD   Type of Anesthesia:  General & SPINAL PER CLEARANCE FORM   Additional requests/questions:    Samantha Carr   05/04/2023, 4:34 PM

## 2023-05-05 NOTE — Telephone Encounter (Signed)
    Primary Cardiologist:Samuel Diona Browner, MD  Chart reviewed as part of pre-operative protocol coverage. Because of Lynise Inari Shin past medical history and time since last visit, he/she will require a follow-up visit in order to better assess preoperative cardiovascular risk.  Pre-op covering staff: - Please schedule Office appointment and call patient to inform them. - Please contact requesting surgeon's office via preferred method (i.e, phone, fax) to inform them of need for appointment prior to surgery.  If applicable, this message will also be routed to pharmacy pool and/or primary cardiologist for input on holding anticoagulant/antiplatelet agent as requested below so that this information is available at time of patient's appointment.   Ronney Asters, NP  05/05/2023, 8:51 AM

## 2023-05-05 NOTE — Telephone Encounter (Signed)
I have reviewed previous notes from 11/2022 when our office received a clearance request. Pt does not follow our practice. Please contact Dr. Rayetta Pigg Assar with [facility].     11/17/22  5:43 PM Note I s/w the pt and she tells me that she does not see our practice and never has. Pt tells me that she is adopted and she knew her biological mother had some heart problems and wanted to make sure she did not have the same issues heart wise. She had seen Dr. Hubert Azure [facility] 07/02/20. Dr. Sharrell Ku ordered some testing but her insurance would not pay where Dr. Sharrell Ku had ordered the test to be done. However, they did cover if she had her test done at [facility], which she did. Our cardiologist Dr. Dina Rich read the test as Dr. Sharrell Ku was not able to read the test. We have never seen the pt in our office and Dr. Wyline Mood only read a test the pt had done per the pt. Pt feels that she does not need a cardiologist and that back 2021 was just wanting to make sure her heart was good. Pt said she s/w Kenn File at surgeon office and explained this to her. She said if Dr. Ranell Patrick really feels that she needs to be seen by a cardiologist she will do what she needs to do. I assured the pt that if that turns out to be the case, that we are more than happy to see her as a NEW PT. I assured the pt that I will fax these notes to Dr. Ranell Patrick. Pt thanked me for the call and the help.         11/17/22  5:35 PM You contacted Earlie Counts

## 2023-05-31 DIAGNOSIS — I10 Essential (primary) hypertension: Secondary | ICD-10-CM | POA: Diagnosis not present

## 2023-05-31 DIAGNOSIS — R7303 Prediabetes: Secondary | ICD-10-CM | POA: Diagnosis not present

## 2023-05-31 DIAGNOSIS — Z1329 Encounter for screening for other suspected endocrine disorder: Secondary | ICD-10-CM | POA: Diagnosis not present

## 2023-05-31 DIAGNOSIS — E7849 Other hyperlipidemia: Secondary | ICD-10-CM | POA: Diagnosis not present

## 2023-05-31 DIAGNOSIS — E559 Vitamin D deficiency, unspecified: Secondary | ICD-10-CM | POA: Diagnosis not present

## 2023-06-02 DIAGNOSIS — M25562 Pain in left knee: Secondary | ICD-10-CM | POA: Diagnosis not present

## 2023-06-06 DIAGNOSIS — Z6833 Body mass index (BMI) 33.0-33.9, adult: Secondary | ICD-10-CM | POA: Diagnosis not present

## 2023-06-06 DIAGNOSIS — E7849 Other hyperlipidemia: Secondary | ICD-10-CM | POA: Diagnosis not present

## 2023-06-06 DIAGNOSIS — Z23 Encounter for immunization: Secondary | ICD-10-CM | POA: Diagnosis not present

## 2023-06-06 DIAGNOSIS — Z96651 Presence of right artificial knee joint: Secondary | ICD-10-CM | POA: Diagnosis not present

## 2023-06-06 DIAGNOSIS — R7303 Prediabetes: Secondary | ICD-10-CM | POA: Diagnosis not present

## 2023-06-06 DIAGNOSIS — M170 Bilateral primary osteoarthritis of knee: Secondary | ICD-10-CM | POA: Diagnosis not present

## 2023-06-06 DIAGNOSIS — I10 Essential (primary) hypertension: Secondary | ICD-10-CM | POA: Diagnosis not present

## 2023-06-06 DIAGNOSIS — Z0001 Encounter for general adult medical examination with abnormal findings: Secondary | ICD-10-CM | POA: Diagnosis not present

## 2023-06-06 DIAGNOSIS — E559 Vitamin D deficiency, unspecified: Secondary | ICD-10-CM | POA: Diagnosis not present

## 2023-07-24 DIAGNOSIS — M1712 Unilateral primary osteoarthritis, left knee: Secondary | ICD-10-CM | POA: Diagnosis not present

## 2023-07-24 DIAGNOSIS — G8918 Other acute postprocedural pain: Secondary | ICD-10-CM | POA: Diagnosis not present

## 2023-07-27 DIAGNOSIS — M25562 Pain in left knee: Secondary | ICD-10-CM | POA: Diagnosis not present

## 2023-08-01 DIAGNOSIS — M25562 Pain in left knee: Secondary | ICD-10-CM | POA: Diagnosis not present

## 2023-08-03 DIAGNOSIS — M25562 Pain in left knee: Secondary | ICD-10-CM | POA: Diagnosis not present

## 2023-08-07 DIAGNOSIS — M25562 Pain in left knee: Secondary | ICD-10-CM | POA: Diagnosis not present

## 2023-08-08 DIAGNOSIS — Z4789 Encounter for other orthopedic aftercare: Secondary | ICD-10-CM | POA: Diagnosis not present

## 2023-08-09 DIAGNOSIS — M25562 Pain in left knee: Secondary | ICD-10-CM | POA: Diagnosis not present

## 2023-08-14 DIAGNOSIS — M25562 Pain in left knee: Secondary | ICD-10-CM | POA: Diagnosis not present

## 2023-08-16 DIAGNOSIS — M25562 Pain in left knee: Secondary | ICD-10-CM | POA: Diagnosis not present

## 2023-08-21 DIAGNOSIS — M25562 Pain in left knee: Secondary | ICD-10-CM | POA: Diagnosis not present

## 2023-08-24 DIAGNOSIS — M25562 Pain in left knee: Secondary | ICD-10-CM | POA: Diagnosis not present

## 2023-08-28 DIAGNOSIS — M25562 Pain in left knee: Secondary | ICD-10-CM | POA: Diagnosis not present

## 2023-08-31 DIAGNOSIS — M25562 Pain in left knee: Secondary | ICD-10-CM | POA: Diagnosis not present

## 2023-09-06 DIAGNOSIS — M25562 Pain in left knee: Secondary | ICD-10-CM | POA: Diagnosis not present

## 2023-09-11 DIAGNOSIS — M25562 Pain in left knee: Secondary | ICD-10-CM | POA: Diagnosis not present

## 2023-10-10 ENCOUNTER — Other Ambulatory Visit: Payer: Self-pay | Admitting: Family Medicine

## 2023-10-10 DIAGNOSIS — Z1231 Encounter for screening mammogram for malignant neoplasm of breast: Secondary | ICD-10-CM

## 2023-11-13 ENCOUNTER — Inpatient Hospital Stay: Admission: RE | Admit: 2023-11-13 | Payer: 59 | Source: Ambulatory Visit

## 2023-11-13 ENCOUNTER — Ambulatory Visit
Admission: RE | Admit: 2023-11-13 | Discharge: 2023-11-13 | Disposition: A | Discharge status: Home / Self Care | Payer: 59 | Source: Ambulatory Visit | Attending: Family Medicine | Admitting: Family Medicine

## 2023-11-13 DIAGNOSIS — Z1231 Encounter for screening mammogram for malignant neoplasm of breast: Secondary | ICD-10-CM | POA: Diagnosis not present

## 2023-11-20 ENCOUNTER — Other Ambulatory Visit: Payer: Self-pay | Admitting: Family Medicine

## 2023-11-20 DIAGNOSIS — R928 Other abnormal and inconclusive findings on diagnostic imaging of breast: Secondary | ICD-10-CM

## 2023-11-23 ENCOUNTER — Other Ambulatory Visit: Payer: Self-pay | Admitting: Family Medicine

## 2023-11-23 ENCOUNTER — Ambulatory Visit
Admission: RE | Admit: 2023-11-23 | Discharge: 2023-11-23 | Disposition: A | Discharge status: Home / Self Care | Payer: 59 | Source: Ambulatory Visit | Attending: Family Medicine | Admitting: Family Medicine

## 2023-11-23 DIAGNOSIS — R92322 Mammographic fibroglandular density, left breast: Secondary | ICD-10-CM | POA: Diagnosis not present

## 2023-11-23 DIAGNOSIS — R921 Mammographic calcification found on diagnostic imaging of breast: Secondary | ICD-10-CM

## 2023-11-23 DIAGNOSIS — R928 Other abnormal and inconclusive findings on diagnostic imaging of breast: Secondary | ICD-10-CM | POA: Diagnosis not present

## 2023-12-28 DIAGNOSIS — M25561 Pain in right knee: Secondary | ICD-10-CM | POA: Diagnosis not present

## 2023-12-28 DIAGNOSIS — M79671 Pain in right foot: Secondary | ICD-10-CM | POA: Diagnosis not present

## 2023-12-28 DIAGNOSIS — Z96653 Presence of artificial knee joint, bilateral: Secondary | ICD-10-CM | POA: Diagnosis not present

## 2024-01-05 DIAGNOSIS — Z96652 Presence of left artificial knee joint: Secondary | ICD-10-CM | POA: Diagnosis not present

## 2024-01-09 DIAGNOSIS — Z96652 Presence of left artificial knee joint: Secondary | ICD-10-CM | POA: Diagnosis not present

## 2024-01-11 DIAGNOSIS — Z96652 Presence of left artificial knee joint: Secondary | ICD-10-CM | POA: Diagnosis not present

## 2024-01-18 DIAGNOSIS — Z96652 Presence of left artificial knee joint: Secondary | ICD-10-CM | POA: Diagnosis not present

## 2024-01-23 DIAGNOSIS — Z96652 Presence of left artificial knee joint: Secondary | ICD-10-CM | POA: Diagnosis not present

## 2024-01-25 DIAGNOSIS — Z96652 Presence of left artificial knee joint: Secondary | ICD-10-CM | POA: Diagnosis not present

## 2024-01-30 DIAGNOSIS — Z96652 Presence of left artificial knee joint: Secondary | ICD-10-CM | POA: Diagnosis not present

## 2024-02-06 DIAGNOSIS — Z96652 Presence of left artificial knee joint: Secondary | ICD-10-CM | POA: Diagnosis not present

## 2024-02-08 DIAGNOSIS — Z96652 Presence of left artificial knee joint: Secondary | ICD-10-CM | POA: Diagnosis not present

## 2024-05-29 ENCOUNTER — Inpatient Hospital Stay
Admission: RE | Admit: 2024-05-29 | Discharge: 2024-05-29 | Discharge status: Home / Self Care | Source: Ambulatory Visit | Attending: Family Medicine | Admitting: Family Medicine

## 2024-05-29 DIAGNOSIS — R921 Mammographic calcification found on diagnostic imaging of breast: Secondary | ICD-10-CM
